# Patient Record
Sex: Male | Born: 1970 | Race: White | Hispanic: No | Marital: Married | Smoking: Never smoker
Health system: Southern US, Community
[De-identification: ages and names within clinical notes are randomized; demographics above are authoritative.]

## PROBLEM LIST (undated history)

## (undated) DIAGNOSIS — F329 Major depressive disorder, single episode, unspecified: Secondary | ICD-10-CM

## (undated) DIAGNOSIS — I10 Essential (primary) hypertension: Secondary | ICD-10-CM

## (undated) DIAGNOSIS — F32A Depression, unspecified: Secondary | ICD-10-CM

## (undated) HISTORY — DX: Major depressive disorder, single episode, unspecified: F32.9

## (undated) HISTORY — DX: Depression, unspecified: F32.A

## (undated) HISTORY — DX: Essential (primary) hypertension: I10

---

## 1998-09-14 ENCOUNTER — Emergency Department (HOSPITAL_COMMUNITY): Admission: EM | Admit: 1998-09-14 | Discharge: 1998-09-14 | Payer: Self-pay | Admitting: Emergency Medicine

## 2003-09-05 ENCOUNTER — Encounter: Payer: Self-pay | Admitting: Emergency Medicine

## 2003-09-06 ENCOUNTER — Inpatient Hospital Stay (HOSPITAL_COMMUNITY): Admission: EM | Admit: 2003-09-06 | Discharge: 2003-09-07 | Payer: Self-pay

## 2003-09-10 ENCOUNTER — Encounter: Admission: RE | Admit: 2003-09-10 | Discharge: 2003-09-10 | Payer: Self-pay | Admitting: Family Medicine

## 2007-08-27 ENCOUNTER — Emergency Department (HOSPITAL_COMMUNITY): Admission: EM | Admit: 2007-08-27 | Discharge: 2007-08-27 | Payer: Self-pay | Admitting: Emergency Medicine

## 2011-04-17 NOTE — H&P (Signed)
NAME:  Cameron Chase, Cameron Chase                           ACCOUNT NO.:  [id number]   MEDICAL RECORD NO.:  [id number]                   PATIENT TYPE:  INP   LOCATION:  3000                                 FACILITY:  [facility]   PHYSICIAN:  Cameron Chase, M.D.             DATE OF BIRTH:  [birth year]   DATE OF ADMISSION:  09/05/2003  DATE OF DISCHARGE:                                HISTORY & PHYSICAL   PRIMARY CARE PHYSICIAN:  Cameron Chase, M.D. at [facility]   REFERRING PHYSICIAN:  None.   CONSULTING PHYSICIAN:  None.   CHIEF COMPLAINT:  Worsening headache.   HISTORY OF PRESENT ILLNESS:  [name] is a 40 year old Caucasian male  who was treated for a Staph infection on his right foot approximately 2  weeks ago.  He was treated with Levaquin. This wound has subsequently  resolved.  The patient then stated this past weekend that he developed a  cold-type syndrome including rhinorrhea, minor fever and chills, decreased  appetite and sore throat.  Then, on Monday, the patient developed a headache  2 days prior to admission.  This headache worsened; and, at the time of  admission, the patient stated that this headache was severe.  He describes  this headache as a throbbing sensation throughout his entire head; and this  headache is worsened by light, by sound, and by any movement of his body. He  also has noticed increased irritability over the past couple of days.   At the time of presentation, the patient states that he has had a mild cough  and that he developed nausea and vomiting today and that last night he had a  fever to 102 degrees F. He denies diarrhea, endorses decreased appetite. He  states that he has severe pain at the base of his head, that his neck feels  stiff and that the pain in his head is greatly increased whenever he moves  his head.  The patient had no recent insect bites, no recent travel history.  Of note though the patient was recently [place] a petting  zoo on August 31, 2003  which is approximately 5 days ago.   ALLERGIES:  No known drug allergies.   MEDICATIONS:  Lexapro 20 mg daily.   PAST MEDICAL HISTORY:  1. A history of Staph infection in his right foot approximately 2 weeks ago     treated with Levaquin.  2. History of depression and anxiety.   SOCIAL HISTORY AND FAMILY HISTORY:  The patient smokes 5 cigarettes a week,  drinks 8-10 drinks a week.  Denies any drug use.  He has a brother with type  1 diabetes.  There is no coronary artery disease and no MI in the family.  He has an aunt with breast cancer.   REVIEW OF SYSTEMS:  Positive for above with positive fevers and nighttime  chills.  Positive for  nausea and vomiting.  Currently negative rhinorrhea.  No hematuria.  No blood in the stool.  Positive MSK pain, positive  arthralgias.  No changes in hearing.  Positive changes in vision, states  that his vision is blurry.   PHYSICAL EXAMINATION:  GENERAL: This is a mildly obese Caucasian male in  obvious discomfort holding his head.  CARDIOVASCULAR:  Regular rate and rhythm no murmurs, rubs, or gallops.  LUNGS:  Clear to auscultation bilaterally.  No wheezes, crackles, or rales.  ABDOMEN:  Positive bowel sounds, nontender, nondistended, soft.  HEENT/NECK: No lymphadenopathy, no exudates, no erythema.  Pupils are  PERRLA.  EXTREMITIES:  The extremities are thin, no rashes.  There is an  approximately a 4 cm healed incision on the dorsum of his right foot.  NEUROLOGIC:  Negative Kernig.  Positive Brudzinski, positive nuchal  rigidity.  Cranial nerves II-XII are intact; 2+ reflexes throughout all  extremities.   LABS:  Admission labs include CBC: White blood count 9.1, H&H 15.4 and 43.7,  platelets 294.  CSF with a white blood cell count of 340 and a second repeat  at 353.  Glucose is 69; total protein at 59; RBC at 3.  Gram stain negative  for organisms.  A CT of the head was within normal limits.   ASSESSMENT AND PLAN:   This is a 40 year old Caucasian male with a 3-day  history of worsening headache.  1. Headache.  Given his symptoms the differential diagnoses include viral     versus bacterial meningitis versus encephalitis versus acute intracranial     process including aneurysm, although the last being very unlikely     considering a normal head CT.  But it looks like the diagnosis is viral     meningitis given his increased WBCs, mild protein elevation, and no     organisms on cerebrospinal fluid; also with his symptoms such as     photophobia on physical exam.  However, given his recent history of staph     infection of his foot and his very recent antibiotic treatment the     cerebrospinal fluid may be lower yield.  It is also felt we need to rule     out bacterial meningitis.  He has no history of recent infection, bites,     or rashes for Lyme disease and other bacteria carrying viruses are lower     on the list. He has no signs or symptoms of encephalitis other than the     ______ concentration ability enterovirus such as coxsackie and _____     viruses are higher on the differential.  It is of note that the patient     stated that he had sore throat prior to onset of his neurologic symptoms.     Other viruses to consider are blood viruses but this  typically presents     more with an encephalitis picture.  Parasitic, herpes simplex viruses and     herpes viruses can also cause this too, however, he has no current     lesions.  Treatment of him will be supportive and provide hydration via     IV and give pain medicines by giving Dilaudid, PCA and Toradol p.r.n.  I     will continue his vancomycin and his Rocephin.  Also give Phenergan for     nausea.  Will repeat CBC and BMET in the a.m.  2. Pain.  Will give Toradol IV and start Dilaudid PCA.  I hesitate  to give     him too much sedation as I would like to have continued evaluation of his    neurologic status.  I will do q.4h. neuro checks.  3.  Depression and anxiety.  I will continue Lexapro.      Cameron Bombard, MD                          Cameron Chase, M.D.    SJ/MEDQ  D:  09/06/2003  T:  09/06/2003  Job:  [id number]

## 2011-04-17 NOTE — Discharge Summary (Signed)
NAME:  PACE, [name] NO.:  [id number]   MEDICAL RECORD NO.:  [id number]                   PATIENT TYPE:  INP   LOCATION:  6126                                 FACILITY:  [facility]   PHYSICIAN:  Penni Bombard, MD                    DATE OF BIRTH:  [birth year]   DATE OF ADMISSION:  09/05/2003  DATE OF DISCHARGE:  09/07/2003                                 DISCHARGE SUMMARY   PRIMARY CARE PHYSICIAN:  Dr. Merla Riches at [facility].   REFERRING PHYSICIAN:  Dr. Merla Riches.   CONSULTING PHYSICIAN:  None.   FINAL DIAGNOSES:  1. Viral meningitis.  2. History of depression and anxiety.   PRINCIPAL PROCEDURES:  1. CT of the head, within normal limits, showed no abnormalities.  2. Lumbar procedure with the following results:  WBC 340, second vial WBC     353, rbc's 3, glucose 69, total protein 59, Gram stain no organisms,     culture is pending.   ADMISSION HP:  Please see chart.   LABORATORY DATA:  CBC:  WBC 9.1, H&H 15.4 and 43.7, platelets 294.  CSF  fluid as above under procedures.   HOSPITAL COURSE:  [name] is a 40 year old Caucasian male who was  treated for a staph infection approximately two weeks ago on his foot with  Levaquin.  This infection was resolving.  He then subsequently this past  weekend developed headache, and that progressively got worse.  On Tuesday  developed emesis with an unrelenting headache.  Presented to the emergency  room where he got a head CT which was within normal limits and a lumbar  puncture which was consistent with viral meningitis.  The patient was  admitted to the teaching service and started on a Dilaudid PCA pump  secondary to the severity of his head pain.  He also was continued on the  vancomycin and Rocephin that was started in the emergency room.  This was  continued since the patient had had one day of antibiotics prior to  admission.  It was thought that there was the possibility of a  partially  treated bacterial meningitis. Given the fact that he had recently had a  staph infection on his foot and given the high incidence of MRSA in this  area, he was continued on vancomycin.  On the first day of admission the  patient had two episodes of emesis with relief of his headache after the  emesis.  His pain was relieved by the Dilaudid PCA.  On the second day of  his hospitalization the PCA was discontinued, and the patient did quite well  on p.o. Vicodin.  The patient was discharged after nearly 48 hours of  hospitalization.  However, his CSF cultures had not yet returned.  It was  thought that the patient was very stable and had improved rapidly.  The  patient was sent out on three days of Omnicef in order to cover him for a  possible partially treated bacterial meningitis until the return of his  cultures, which will be possibly 48 hours from the time of discharge.  At  the time of dictation the patient was stable, had a minor headache which was  responsive to Vicodin, and was tolerating p.o. well.    INSTRUCTIONS TO PATIENT AND FAMILY:  The patient was instructed to return to  Dr. Netta Corrigan office or to the emergency room if he had worsening  headache, fevers, nausea, or vomiting.  I had told the patient I would call  him with the results of his CSF culture when they returned in approximately  two days.                                                Penni Bombard, MD    SJ/MEDQ  D:  09/07/2003  T:  09/08/2003  Job:  [id number]   cc:   Harrel Lemon. Merla Riches, M.D.  [address]  [place]  [place] [address]  Fax: [phone]

## 2011-09-10 LAB — DIFFERENTIAL
Basophils Absolute: 0
Basophils Relative: 1
Eosinophils Absolute: 0.2
Eosinophils Relative: 3
Lymphocytes Relative: 15
Lymphs Abs: 1
Monocytes Absolute: 0.6
Monocytes Relative: 10
Neutro Abs: 4.3
Neutrophils Relative %: 70

## 2011-09-10 LAB — I-STAT 8, (EC8 V) (CONVERTED LAB)
Acid-Base Excess: 1
Bicarbonate: 26.5 — ABNORMAL HIGH
HCT: 42
Operator id: 285491
pCO2, Ven: 43.2 — ABNORMAL LOW

## 2011-09-10 LAB — URINALYSIS, ROUTINE W REFLEX MICROSCOPIC
Bilirubin Urine: NEGATIVE
Glucose, UA: NEGATIVE
Hgb urine dipstick: NEGATIVE
Ketones, ur: NEGATIVE
Nitrite: NEGATIVE
Protein, ur: NEGATIVE
Specific Gravity, Urine: 1.022
Urobilinogen, UA: 1
pH: 7

## 2011-09-10 LAB — CBC
HCT: 41.6
Hemoglobin: 14.5
MCHC: 34.9
MCV: 86.5
Platelets: 272
RBC: 4.81
RDW: 12.7
WBC: 6.2

## 2011-09-10 LAB — POCT I-STAT CREATININE: Creatinine, Ser: 1

## 2012-07-18 DIAGNOSIS — F329 Major depressive disorder, single episode, unspecified: Secondary | ICD-10-CM | POA: Insufficient documentation

## 2012-07-18 DIAGNOSIS — F32A Depression, unspecified: Secondary | ICD-10-CM | POA: Insufficient documentation

## 2012-07-18 DIAGNOSIS — I10 Essential (primary) hypertension: Secondary | ICD-10-CM | POA: Insufficient documentation

## 2012-07-29 DIAGNOSIS — N529 Male erectile dysfunction, unspecified: Secondary | ICD-10-CM | POA: Insufficient documentation

## 2012-09-24 ENCOUNTER — Ambulatory Visit: Payer: Self-pay | Admitting: Physician Assistant

## 2013-10-23 DIAGNOSIS — E669 Obesity, unspecified: Secondary | ICD-10-CM | POA: Insufficient documentation

## 2013-12-09 ENCOUNTER — Ambulatory Visit (INDEPENDENT_AMBULATORY_CARE_PROVIDER_SITE_OTHER): Payer: Commercial Managed Care - PPO | Admitting: Physician Assistant

## 2013-12-09 VITALS — BP 142/78 | HR 78 | Temp 98.6°F | Resp 18 | Ht 75.0 in | Wt 253.0 lb

## 2013-12-09 DIAGNOSIS — M79609 Pain in unspecified limb: Secondary | ICD-10-CM

## 2013-12-09 DIAGNOSIS — L600 Ingrowing nail: Secondary | ICD-10-CM

## 2013-12-09 DIAGNOSIS — M79675 Pain in left toe(s): Secondary | ICD-10-CM

## 2013-12-09 MED ORDER — DOXYCYCLINE HYCLATE 100 MG PO CAPS: 100.0000 mg | ORAL_CAPSULE | Freq: Two times a day (BID) | ORAL | Status: DC

## 2013-12-09 MED ORDER — HYDROCODONE-ACETAMINOPHEN 5-325 MG PO TABS
1.0000 | ORAL_TABLET | Freq: Four times a day (QID) | ORAL | Status: DC | PRN
Start: 2013-12-09 — End: 2018-07-07

## 2013-12-09 NOTE — Progress Notes (Signed)
   Subjective:    Patient ID: Cameron Chase, male    DOB: 1971-01-11, 43 y.o.   MRN: [id number]  HPI 43 year old male presents for evaluation of an ingrown toenail of his left great toe.  States 2 days ago he noticed that the lateral nail fold became very painful and started draining pus. Is not sure if the nail was ingrown before that, but thinks that it possibly could have been. Was not painful prior to 2 days ago.  Denies fever, chills, nausea, vomiting, or headache.  Hx of ingrown nail in the past.   Patient is otherwise doing well with no other concerns today.     Review of Systems  Constitutional: Negative for fever and chills.  Gastrointestinal: Negative for nausea and vomiting.  Skin: Positive for color change.  Neurological: Negative for headaches.       Objective:   Physical Exam  Constitutional: He is oriented to person, place, and time. He appears well-developed and well-nourished.  HENT:  Head: Normocephalic and atraumatic.  Right Ear: External ear normal.  Left Ear: External ear normal.  Mouth/Throat: Oropharynx is clear and moist. No oropharyngeal exudate.  Eyes: Conjunctivae are normal.  Neck: Normal range of motion.  Cardiovascular: Normal rate.   Pulmonary/Chest: Effort normal.  Neurological: He is alert and oriented to person, place, and time.  Skin:  Left great toe has proud flesh overgrowing lateral nail.  + pain to touch with purulent drainage from nail fold.  No surrounding cellulitis or pain with ROM of toe.   Psychiatric: He has a normal mood and affect. His behavior is normal. Judgment and thought content normal.     PROCEDURE NOTE: Verbal consent obtained.  Risks and benefits explained.  Patient made informed decision under sound mind and judgement. Sterile technique employed. Numbing: Anesthesia obtained with 1:1 ratio 2% plain lidocaine with 0.5% Marcaine, 4 cc total. Betadine prep per usual protocol.  Lateral nail lifted without difficulty and  removed in total. Proximal aspect of nail bed explored revealing no nail remnants. Ingrown tissue debrided and irrigated. No active bleeding. Xeroform dressing applied. Wound care instructions including precautions covered with patient. Handout given.       Assessment & Plan:  Ingrown toenail - Plan: doxycycline (VIBRAMYCIN) 100 MG capsule  Toe pain, left - Plan: HYDROcodone-acetaminophen (NORCO) 5-325 MG per tablet  Discussed at home after care Norco 5/325 mg to take for severe pain RTC precautions discussed Doxycycline 100 mg bid x 10 days Follow up as needed.

## 2015-10-08 ENCOUNTER — Ambulatory Visit (INDEPENDENT_AMBULATORY_CARE_PROVIDER_SITE_OTHER): Payer: Commercial Managed Care - PPO | Admitting: Family Medicine

## 2015-10-08 VITALS — BP 128/90 | HR 98 | Temp 98.5°F | Resp 18 | Ht 77.0 in | Wt 274.4 lb

## 2015-10-08 DIAGNOSIS — Z23 Encounter for immunization: Secondary | ICD-10-CM

## 2015-10-08 DIAGNOSIS — H1013 Acute atopic conjunctivitis, bilateral: Secondary | ICD-10-CM | POA: Diagnosis not present

## 2015-10-08 DIAGNOSIS — H00013 Hordeolum externum right eye, unspecified eyelid: Secondary | ICD-10-CM

## 2015-10-08 DIAGNOSIS — H01003 Unspecified blepharitis right eye, unspecified eyelid: Secondary | ICD-10-CM

## 2015-10-08 MED ORDER — OFLOXACIN 0.3 % OP SOLN: 2.0000 [drp] | Freq: Four times a day (QID) | OPHTHALMIC | Status: DC

## 2015-10-08 MED ORDER — OLOPATADINE HCL 0.1 % OP SOLN: 1.0000 [drp] | Freq: Two times a day (BID) | OPHTHALMIC | Status: DC

## 2015-10-08 MED ORDER — AMOXICILLIN-POT CLAVULANATE 875-125 MG PO TABS: 1.0000 | ORAL_TABLET | Freq: Two times a day (BID) | ORAL | Status: DC

## 2015-10-08 NOTE — Patient Instructions (Addendum)
Augmentin 875 one twice daily (amoxicillin/clavulanate)  Ofloxacin eyedrops 1 or 2 drops every 3-4 hours for the first couple of days, then 4 times daily  Return at any time if abruptly worse, or if not improving at all over the next 48 hours please return  Try Allegra for your allergy (fexofenadine)  If that does not do the job try Patanol eyedrops.     Stye A stye is a bump on your eyelid caused by a bacterial infection. A stye can form inside the eyelid (internal stye) or outside the eyelid (external stye). An internal stye may be caused by an infected oil-producing gland inside your eyelid. An external stye may be caused by an infection at the base of your eyelash (hair follicle). Styes are very common. Anyone can get them at any age. They usually occur in just one eye, but you may have more than one in either eye.  CAUSES  The infection is almost always caused by bacteria called Staphylococcus aureus. This is a common type of bacteria that lives on your skin. RISK FACTORS You may be at higher risk for a stye if you have had one before. You may also be at higher risk if you have:  Diabetes.  Long-term illness.  Long-term eye redness.  A skin condition called seborrhea.  High fat levels in your blood (lipids). SIGNS AND SYMPTOMS  Eyelid pain is the most common symptom of a stye. Internal styes are more painful than external styes. Other signs and symptoms may include:  Painful swelling of your eyelid.  A scratchy feeling in your eye.  Tearing and redness of your eye.  Pus draining from the stye. DIAGNOSIS  Your health care provider may be able to diagnose a stye just by examining your eye. The health care provider may also check to make sure:  You do not have a fever or other signs of a more serious infection.  The infection has not spread to other parts of your eye or areas around your eye. TREATMENT  Most styes will clear up in a few days without treatment. In some  cases, you may need to use antibiotic drops or ointment to prevent infection. Your health care provider may have to drain the stye surgically if your stye is:  Large.  Causing a lot of pain.  Interfering with your vision. This can be done using a thin blade or a needle.  HOME CARE INSTRUCTIONS   Take medicines only as directed by your health care provider.  Apply a clean, warm compress to your eye for 10 minutes, 4 times a day.  Do not wear contact lenses or eye makeup until your stye has healed.  Do not try to pop or drain the stye. SEEK MEDICAL CARE IF:  You have chills or a fever.  Your stye does not go away after several days.  Your stye affects your vision.  Your eyeball becomes swollen, red, or painful. MAKE SURE YOU:  Understand these instructions.  Will watch your condition.  Will get help right away if you are not doing well or get worse.   This information is not intended to replace advice given to you by your health care provider. Make sure you discuss any questions you have with your health care provider.   Document Released: 08/26/2005 Document Revised: 12/07/2014 Document Reviewed: 03/02/2014 Elsevier Interactive Patient Education [employer]2016 Elsevier Inc.

## 2015-10-08 NOTE — Progress Notes (Signed)
Patient ID: Cameron Chase, male    DOB: 1971/10/25  Age: 44 y.o. MRN: [id number]  Chief Complaint  Patient presents with  . Eye Injury     right eye, couple days  . Flu Vaccine    Subjective:   44 year old man with a couple day history of the right eye getting swollen and painful.No injury. He has not been working out and a lot of dust or exposed foreign bodies. He had a routine weekend before this well. He has been trying to use warm compresses on it. Also has used an OTC moistening propylene glycol eyedrops. He wears reading glasses. He has had styes in the past, but nothing like this.  Current allergies, medications, problem list, past/family and social histories reviewed.  Objective:  BP 128/90 mmHg  Pulse 98  Temp(Src) 98.5 F (36.9 C) (Oral)  Resp 18  Ht 6\' 5"  (1.956 m)  Wt 274 lb 6.4 oz (124.467 kg)  BMI 32.53 kg/m2  SpO2 98%  Swollen right upper eyelid. Conjunctiva looks okay except for little pus draining around to it. The site of abscessing of the R-wave is on the medial one third of the upper lid. His fundi are benign. EOMs intact.  Assessment & Plan:   Assessment: 1. Sty, right   2. Blepharitis of right eye   3. Needs flu shot   4. Allergic conjunctivitis, bilateral       Plan: Try over-the-counter Allegra. Also gave him  prescription for some allergy eyedrops. Flu shot today.  Orders Placed This Encounter  Procedures  . Flu Vaccine QUAD 36+ mos IM    Meds ordered this encounter  Medications  . amoxicillin-clavulanate (AUGMENTIN) 875-125 MG tablet    Sig: Take 1 tablet by mouth 2 (two) times daily.    Dispense:  14 tablet    Refill:  0  . ofloxacin (OCUFLOX) 0.3 % ophthalmic solution    Sig: Place 2 drops into the right eye 4 (four) times daily.    Dispense:  5 mL    Refill:  0  . olopatadine (PATANOL) 0.1 % ophthalmic solution    Sig: Place 1 drop into both eyes 2 (two) times daily.    Dispense:  5 mL    Refill:  12         Patient  Instructions  Augmentin 875 one twice daily (amoxicillin/clavulanate)  Ofloxacin eyedrops 1 or 2 drops every 3-4 hours for the first couple of days, then 4 times daily  Return at any time if abruptly worse, or if not improving at all over the next 48 hours please return  Stye A stye is a bump on your eyelid caused by a bacterial infection. A stye can form inside the eyelid (internal stye) or outside the eyelid (external stye). An internal stye may be caused by an infected oil-producing gland inside your eyelid. An external stye may be caused by an infection at the base of your eyelash (hair follicle). Styes are very common. Anyone can get them at any age. They usually occur in just one eye, but you may have more than one in either eye.  CAUSES  The infection is almost always caused by bacteria called Staphylococcus aureus. This is a common type of bacteria that lives on your skin. RISK FACTORS You may be at higher risk for a stye if you have had one before. You may also be at higher risk if you have:  Diabetes.  Long-term illness.  Long-term eye redness.  A  skin condition called seborrhea.  High fat levels in your blood (lipids). SIGNS AND SYMPTOMS  Eyelid pain is the most common symptom of a stye. Internal styes are more painful than external styes. Other signs and symptoms may include:  Painful swelling of your eyelid.  A scratchy feeling in your eye.  Tearing and redness of your eye.  Pus draining from the stye. DIAGNOSIS  Your health care provider may be able to diagnose a stye just by examining your eye. The health care provider may also check to make sure:  You do not have a fever or other signs of a more serious infection.  The infection has not spread to other parts of your eye or areas around your eye. TREATMENT  Most styes will clear up in a few days without treatment. In some cases, you may need to use antibiotic drops or ointment to prevent infection. Your health  care provider may have to drain the stye surgically if your stye is:  Large.  Causing a lot of pain.  Interfering with your vision. This can be done using a thin blade or a needle.  HOME CARE INSTRUCTIONS   Take medicines only as directed by your health care provider.  Apply a clean, warm compress to your eye for 10 minutes, 4 times a day.  Do not wear contact lenses or eye makeup until your stye has healed.  Do not try to pop or drain the stye. SEEK MEDICAL CARE IF:  You have chills or a fever.  Your stye does not go away after several days.  Your stye affects your vision.  Your eyeball becomes swollen, red, or painful. MAKE SURE YOU:  Understand these instructions.  Will watch your condition.  Will get help right away if you are not doing well or get worse.   This information is not intended to replace advice given to you by your health care provider. Make sure you discuss any questions you have with your health care provider.   Document Released: 08/26/2005 Document Revised: 12/07/2014 Document Reviewed: 03/02/2014 Elsevier Interactive Patient Education 2016 [employer].      No Follow-up on file.   Cameron Chase,[name], MD 10/08/2015

## 2016-02-07 DIAGNOSIS — E785 Hyperlipidemia, unspecified: Secondary | ICD-10-CM | POA: Insufficient documentation

## 2016-10-20 ENCOUNTER — Other Ambulatory Visit: Payer: Self-pay | Admitting: Family Medicine

## 2016-10-20 DIAGNOSIS — H1013 Acute atopic conjunctivitis, bilateral: Secondary | ICD-10-CM

## 2016-10-24 NOTE — Telephone Encounter (Signed)
Last ov 10/2015

## 2016-11-19 ENCOUNTER — Emergency Department (INDEPENDENT_AMBULATORY_CARE_PROVIDER_SITE_OTHER)
Admission: EM | Admit: 2016-11-19 | Discharge: 2016-11-19 | Disposition: A | Discharge status: Home / Self Care | Payer: Commercial Managed Care - PPO | Source: Home / Self Care | Attending: Family Medicine | Admitting: Family Medicine

## 2016-11-19 DIAGNOSIS — J069 Acute upper respiratory infection, unspecified: Secondary | ICD-10-CM

## 2016-11-19 MED ORDER — BENZONATATE 100 MG PO CAPS
100.0000 mg | ORAL_CAPSULE | Freq: Three times a day (TID) | ORAL | 0 refills | Status: DC
Start: 2016-11-19 — End: 2018-07-07

## 2016-11-19 MED ORDER — AZITHROMYCIN 250 MG PO TABS: 250.0000 mg | ORAL_TABLET | Freq: Every day | ORAL | 0 refills | Status: DC

## 2016-11-19 NOTE — ED Triage Notes (Signed)
About 10 days ago, started with sore throat, cough, congestion.  Runny nose greenish yellow mucous

## 2016-11-19 NOTE — ED Provider Notes (Signed)
CSN: [id number]     Arrival date & time 11/19/16  1845 History   First MD Initiated Contact with Patient 11/19/16 1908     Chief Complaint  Patient presents with  . Cough   (Consider location/radiation/quality/duration/timing/severity/associated sxs/prior Treatment) HPI  Cameron Chase is a 45 y.o. male presenting to UC with c/o 10 days of worsening productive cough with yellow-green sputum, associated sinus congestion, pressure and sore throat. Multiple family members have also been sick including his daughter who is in UC now to be seen for 2 weeks of similar symptoms.  He has tried OTC cough medication with minimal relief.  Denies chest pain or SOB. No hx of asthma.    Past Medical History:  Diagnosis Date  . Depression   . Hypertension    History reviewed. No pertinent surgical history. Family History  Problem Relation Age of Onset  . Hyperlipidemia Mother   . Cancer Mother   . Depression Mother   . Hyperlipidemia Father   . Hypertension Father   . Diabetes Father   . Diabetes Brother   . Stroke Maternal Grandmother   . Mental retardation Maternal Grandfather    Social History  Substance Use Topics  . Smoking status: Never Smoker  . Smokeless tobacco: Never Used  . Alcohol use Yes    Review of Systems  Constitutional: Negative for chills and fever.  HENT: Positive for congestion, postnasal drip, rhinorrhea, sinus pain, sinus pressure and sore throat. Negative for ear pain, trouble swallowing and voice change.   Respiratory: Positive for cough. Negative for shortness of breath.   Cardiovascular: Negative for chest pain and palpitations.  Gastrointestinal: Negative for abdominal pain, diarrhea, nausea and vomiting.  Musculoskeletal: Negative for arthralgias, back pain and myalgias.  Skin: Negative for rash.    Allergies  Patient has no known allergies.  Home Medications   Prior to Admission medications   Medication Sig Start Date End Date Taking? Authorizing  Provider  amoxicillin-clavulanate (AUGMENTIN) 875-125 MG tablet Take 1 tablet by mouth 2 (two) times daily. 10/08/15   Peyton Najjaravid H Hopper, MD  azithromycin (ZITHROMAX) 250 MG tablet Take 1 tablet (250 mg total) by mouth daily. Take first 2 tablets together, then 1 every day until finished. 11/19/16   Junius FinnerErin O'Malley, PA-C  benzonatate (TESSALON) 100 MG capsule Take 1-2 capsules (100-200 mg total) by mouth every 8 (eight) hours. 11/19/16   Junius FinnerErin O'Malley, PA-C  BuPROPion HCl (WELLBUTRIN PO) Take by mouth.    Historical Provider, MD  doxycycline (VIBRAMYCIN) 100 MG capsule Take 1 capsule (100 mg total) by mouth 2 (two) times daily. Patient not taking: Reported on 10/08/2015 12/09/13   Nelva NayHeather M Marte, PA-C  HYDROcodone-acetaminophen (NORCO) 5-325 MG per tablet Take 1 tablet by mouth every 6 (six) hours as needed. Patient not taking: Reported on 10/08/2015 12/09/13   Nelva NayHeather M Marte, PA-C  ofloxacin (OCUFLOX) 0.3 % ophthalmic solution Place 2 drops into the right eye 4 (four) times daily. 10/08/15   Peyton Najjaravid H Hopper, MD  olopatadine (PATANOL) 0.1 % ophthalmic solution Place 1 drop into both eyes 2 (two) times daily. 10/08/15   Peyton Najjaravid H Hopper, MD  PRESCRIPTION MEDICATION Blood pressure medicine Unknown name    Historical Provider, MD   Meds Ordered and Administered this Visit  Medications - No data to display  BP 120/83 (BP Location: Left Arm)   Pulse 99   Temp 98.2 F (36.8 C) (Oral)   Ht 6\' 3"  (1.905 m)   Wt 275 lb (124.7  kg)   SpO2 97%   BMI 34.37 kg/m  No data found.   Physical Exam  Constitutional: He appears well-developed and well-nourished. No distress.  HENT:  Head: Normocephalic and atraumatic.  Right Ear: Tympanic membrane normal.  Left Ear: Tympanic membrane normal.  Nose: Mucosal edema present.  Mouth/Throat: Uvula is midline, oropharynx is clear and moist and mucous membranes are normal.  Eyes: Conjunctivae are normal. No scleral icterus.  Neck: Normal range of motion. Neck supple.   Cardiovascular: Normal rate, regular rhythm and normal heart sounds.   Pulmonary/Chest: Effort normal and breath sounds normal. No stridor. No respiratory distress. He has no wheezes. He has no rales. He exhibits no tenderness.  Diffuse rhonchi, clears with cough. No respiratory distress.  Musculoskeletal: Normal range of motion.  Lymphadenopathy:    He has no cervical adenopathy.  Neurological: He is alert.  Skin: Skin is warm and dry. He is not diaphoretic.  Nursing note and vitals reviewed.   Urgent Care Course   Clinical Course     Procedures (including critical care time)  Labs Review Labs Reviewed - No data to display  Imaging Review No results found.   MDM   1. Upper respiratory tract infection, unspecified type    Pt c/o 10 days of worsening sinus symptoms and productive cough with green sputum.  Will cover for bacterial cause.  Rx: azithromycin and tessalon May continue mucinex.  F/u with PCP in 1 week if not improving.     Junius Finnerrin O'Malley, PA-C 11/19/16 1950

## 2017-01-12 ENCOUNTER — Other Ambulatory Visit: Payer: Self-pay | Admitting: Family Medicine

## 2017-01-12 DIAGNOSIS — H00013 Hordeolum externum right eye, unspecified eyelid: Secondary | ICD-10-CM

## 2017-01-12 DIAGNOSIS — H01003 Unspecified blepharitis right eye, unspecified eyelid: Secondary | ICD-10-CM

## 2017-01-14 NOTE — Telephone Encounter (Signed)
Contacted patient via phone and he did not answer. I left a message stating that the ofloxacn eye drops requested are meant to be used for acute eye disorders and not long term. If he is having an acute eye issue that he thinks warrants these eye drops, he needs to seek care immediately at our office or the ED for further evaluation as we cannot prescribe this medication without evaluating his eye.

## 2017-01-29 ENCOUNTER — Ambulatory Visit (HOSPITAL_COMMUNITY)
Admission: EM | Admit: 2017-01-29 | Discharge: 2017-01-29 | Disposition: A | Discharge status: Home / Self Care | Payer: Commercial Managed Care - PPO | Attending: Family Medicine | Admitting: Family Medicine

## 2017-01-29 ENCOUNTER — Encounter (HOSPITAL_COMMUNITY): Payer: Self-pay | Admitting: *Deleted

## 2017-01-29 DIAGNOSIS — H1033 Unspecified acute conjunctivitis, bilateral: Secondary | ICD-10-CM

## 2017-01-29 MED ORDER — CIPROFLOXACIN HCL 0.3 % OP SOLN: 2.0000 [drp] | OPHTHALMIC | 0 refills | Status: DC

## 2017-01-29 NOTE — ED Triage Notes (Signed)
Started  Out   As  [employer]     Irritated  r    Eye    yest      No  Known  Causative   No  Known  Injury       Today   The  Left  Eye  Is  Red  As  Well   Feels  Gritty    Matted  This  Am   When    He  Woke      Up

## 2017-01-29 NOTE — ED Provider Notes (Cosign Needed)
CSN: [id number]     Arrival date & time 01/29/17  1840 History   None    Chief Complaint  Patient presents with  . Eye Problem   (Consider location/radiation/quality/duration/timing/severity/associated sxs/prior Treatment) 46 year old male patient presents with a 24-hour history of reddened eyes. Stating initially his symptoms started yesterday in the right eye, stating he felt the sensation as if there was something in his eye. He states later in the day when he woke up his eye was matted shut. States he cleaned his eyes out and then this morning both eyes were matted shut with discharge. His and no change in vision, he has no blurring, he has no eye pain, no light sensitivity, or other complaint.   The history is provided by the patient.  Eye Problem    Past Medical History:  Diagnosis Date  . Depression   . Hypertension    History reviewed. No pertinent surgical history. Family History  Problem Relation Age of Onset  . Hyperlipidemia Mother   . Cancer Mother   . Depression Mother   . Hyperlipidemia Father   . Hypertension Father   . Diabetes Father   . Diabetes Brother   . Stroke Maternal Grandmother   . Mental retardation Maternal Grandfather    Social History  Substance Use Topics  . Smoking status: Never Smoker  . Smokeless tobacco: Never Used  . Alcohol use Yes    Review of Systems  Reason unable to perform ROS: As covered in history of present illness.  All other systems reviewed and are negative.   Allergies  Patient has no known allergies.  Home Medications   Prior to Admission medications   Medication Sig Start Date End Date Taking? Authorizing Provider  amoxicillin-clavulanate (AUGMENTIN) 875-125 MG tablet Take 1 tablet by mouth 2 (two) times daily. 10/08/15   Peyton Najjaravid H Hopper, MD  azithromycin (ZITHROMAX) 250 MG tablet Take 1 tablet (250 mg total) by mouth daily. Take first 2 tablets together, then 1 every day until finished. 11/19/16   Junius FinnerErin O'Malley,  PA-C  benzonatate (TESSALON) 100 MG capsule Take 1-2 capsules (100-200 mg total) by mouth every 8 (eight) hours. 11/19/16   Junius FinnerErin O'Malley, PA-C  BuPROPion HCl (WELLBUTRIN PO) Take by mouth.    Historical Provider, MD  ciprofloxacin (CILOXAN) 0.3 % ophthalmic solution Place 2 drops into both eyes every 2 (two) hours. Administer 1 drop, every 2 hours, while awake, for 2 days. Then 1 drop, every 4 hours, while awake, for the next 5 days. 01/29/17   Dorena BodoLawrence Jamarl Pew, NP  doxycycline (VIBRAMYCIN) 100 MG capsule Take 1 capsule (100 mg total) by mouth 2 (two) times daily. Patient not taking: Reported on 10/08/2015 12/09/13   Nelva NayHeather M Marte, PA-C  HYDROcodone-acetaminophen (NORCO) 5-325 MG per tablet Take 1 tablet by mouth every 6 (six) hours as needed. Patient not taking: Reported on 10/08/2015 12/09/13   Nelva NayHeather M Marte, PA-C  ofloxacin (OCUFLOX) 0.3 % ophthalmic solution Place 2 drops into the right eye 4 (four) times daily. 10/08/15   Peyton Najjaravid H Hopper, MD  olopatadine (PATANOL) 0.1 % ophthalmic solution Place 1 drop into both eyes 2 (two) times daily. 10/08/15   Peyton Najjaravid H Hopper, MD  PRESCRIPTION MEDICATION Blood pressure medicine Unknown name    Historical Provider, MD   Meds Ordered and Administered this Visit  Medications - No data to display  BP 158/99 (BP Location: Right Arm)   Pulse 80   Temp 98.6 F (37 C) (Oral)   Resp 18  No data found.   Physical Exam  Constitutional: He is oriented to person, place, and time. He appears well-developed and well-nourished. No distress.  HENT:  Head: Normocephalic and atraumatic.  Right Ear: External ear normal.  Left Ear: External ear normal.  Eyes: EOM and lids are normal. Pupils are equal, round, and reactive to light. Lids are everted and swept, no foreign bodies found. Right eye exhibits discharge and exudate. Right eye exhibits no hordeolum. No foreign body present in the right eye. Left eye exhibits discharge and exudate. Left eye exhibits no hordeolum.  No foreign body present in the left eye. Right conjunctiva is injected. Right conjunctiva has no hemorrhage. Left conjunctiva is injected. Left conjunctiva has no hemorrhage.  Neck: Normal range of motion. Neck supple. No JVD present.  Lymphadenopathy:    He has no cervical adenopathy.  Neurological: He is alert and oriented to person, place, and time.  Skin: Skin is warm and dry. Capillary refill takes less than 2 seconds. He is not diaphoretic.  Psychiatric: He has a normal mood and affect.  Nursing note and vitals reviewed.   Urgent Care Course     Procedures (including critical care time)  Labs Review Labs Reviewed - No data to display  Imaging Review No results found.   Visual Acuity Review  Right Eye Distance:  20/25 Left Eye Distance:  20/15 Bilateral Distance:  2015     MDM   1. Acute bacterial conjunctivitis of both eyes    You have bacterial conjunctivitis. I prescribed Cipro eyedrops. Place 2 drops in each eye every 2 hours while awake for the next 2 days, for the following 5 days, place one to 2 drops in each eye every 4-8 hours while awake. I also recommended taking an over-the-counter as antihistamine such as Claritin, Allegra, Zyrtec, or Xyzal. Should your symptoms persist, follow up with her primary care provider, an ophthalmologist, or return to clinic.     Dorena Bodo, NP 01/29/17 2041

## 2017-01-29 NOTE — ED Notes (Signed)
Visual  Acuity   20/  15  Left     20/25    Right

## 2017-01-29 NOTE — Discharge Instructions (Addendum)
You have bacterial conjunctivitis. I prescribed Cipro eyedrops. Place 2 drops in each eye every 2 hours while awake for the next 2 days, for the following 5 days, place one to 2 drops in each eye every 4-8 hours while awake. I also recommended taking an over-the-counter as antihistamine such as Claritin, Allegra, Zyrtec, or Xyzal. Should your symptoms persist, follow up with her primary care provider, an ophthalmologist, or return to clinic.

## 2018-06-24 ENCOUNTER — Encounter: Payer: Commercial Managed Care - PPO | Admitting: Family Medicine

## 2018-07-07 ENCOUNTER — Other Ambulatory Visit: Payer: Self-pay

## 2018-07-07 ENCOUNTER — Encounter: Payer: Self-pay | Admitting: Family Medicine

## 2018-07-07 ENCOUNTER — Ambulatory Visit (INDEPENDENT_AMBULATORY_CARE_PROVIDER_SITE_OTHER): Payer: Commercial Managed Care - PPO | Admitting: Family Medicine

## 2018-07-07 VITALS — BP 130/83 | HR 86 | Temp 97.7°F | Ht 75.0 in | Wt 276.4 lb

## 2018-07-07 DIAGNOSIS — Z114 Encounter for screening for human immunodeficiency virus [HIV]: Secondary | ICD-10-CM | POA: Diagnosis not present

## 2018-07-07 DIAGNOSIS — Z0000 Encounter for general adult medical examination without abnormal findings: Secondary | ICD-10-CM

## 2018-07-07 DIAGNOSIS — Z13220 Encounter for screening for lipoid disorders: Secondary | ICD-10-CM

## 2018-07-07 DIAGNOSIS — Z6834 Body mass index (BMI) 34.0-34.9, adult: Secondary | ICD-10-CM | POA: Diagnosis not present

## 2018-07-07 DIAGNOSIS — I10 Essential (primary) hypertension: Secondary | ICD-10-CM

## 2018-07-07 DIAGNOSIS — F329 Major depressive disorder, single episode, unspecified: Secondary | ICD-10-CM

## 2018-07-07 DIAGNOSIS — Z131 Encounter for screening for diabetes mellitus: Secondary | ICD-10-CM

## 2018-07-07 DIAGNOSIS — F32A Depression, unspecified: Secondary | ICD-10-CM

## 2018-07-07 MED ORDER — BUPROPION HCL 100 MG PO TABS: 100.0000 mg | ORAL_TABLET | Freq: Every day | ORAL | 1 refills | Status: DC

## 2018-07-07 MED ORDER — HYDROCHLOROTHIAZIDE 25 MG PO TABS: ORAL_TABLET | ORAL | 1 refills | Status: DC

## 2018-07-07 MED ORDER — AMLODIPINE BESYLATE 5 MG PO TABS: 5.0000 mg | ORAL_TABLET | Freq: Every day | ORAL | 1 refills | Status: DC

## 2018-07-07 MED ORDER — LOSARTAN POTASSIUM 50 MG PO TABS: ORAL_TABLET | ORAL | 1 refills | Status: DC

## 2018-07-07 NOTE — Patient Instructions (Addendum)
Cut back on fast food and prepare your own food as much as possible. continue exercise, but if you need further assistance with weight loss: Cameron Quincearen Beasley, MD Medical Weight Loss Management  . [phone] .  Keeping you healthy  Get these tests  Blood pressure- Have your blood pressure checked once a year by your healthcare provider.  Normal blood pressure is 120/80.  Weight- Have your body mass index (BMI) calculated to screen for obesity.  BMI is a measure of body fat based on height and weight. You can also calculate your own BMI at [email or web address]://www.west-esparza.com/www.[email or web address]/bmi/.  Cholesterol- Have your cholesterol checked regularly starting at age 47, sooner may be necessary if you have diabetes, high blood pressure, if a family member developed heart diseases at an early age or if you smoke.   Chlamydia, HIV, and other sexual transmitted disease- Get screened each year until the age of 47 then within three months of each new sexual partner.  Diabetes- Have your blood sugar checked regularly if you have high blood pressure, high cholesterol, a family history of diabetes or if you are overweight.  Get these vaccines  Flu shot- Every fall.  Tetanus shot- Every 10 years.  Menactra- Single dose; prevents meningitis.  Take these steps  Don't smoke- If you do smoke, ask your healthcare provider about quitting. For tips on how to quit, go to www.smokefree.gov or call 1-800-QUIT-NOW.  Be physically active- Exercise 5 days a week for at least 30 minutes.  If you are not already physically active start slow and gradually work up to 30 minutes of moderate physical activity.  Examples of moderate activity include walking briskly, mowing the yard, dancing, swimming bicycling, etc.  Eat a healthy diet- Eat a variety of healthy foods such as fruits, vegetables, low fat milk, low fat cheese, yogurt, lean meats, poultry, fish, beans, tofu, etc.  For more information on healthy eating, go to  www.thenutritionsource.org  Drink alcohol in moderation- Limit alcohol intake two drinks or less a day.  Never drink and drive.  Dentist- Brush and floss teeth twice daily; visit your dentis twice a year.  Depression-Your emotional health is as important as your physical health.  If you're feeling down, losing interest in things you normally enjoy please talk with your healthcare provider.  Gun Safety- If you keep a gun in your home, keep it unloaded and with the safety lock on.  Bullets should be stored separately.  Helmet use- Always wear a helmet when riding a motorcycle, bicycle, rollerblading or skateboarding.  Safe sex- If you may be exposed to a sexually transmitted infection, use a condom  Seat belts- Seat bels can save your life; always wear one.  Smoke/Carbon Monoxide detectors- These detectors need to be installed on the appropriate level of your home.  Replace batteries at least once a year.  Skin Cancer- When out in the sun, cover up and use sunscreen SPF 15 or higher.  Violence- If anyone is threatening or hurting you, please tell your healthcare provider.   IF you received an x-ray today, you will receive an invoice from [facility]. Please contact [facility] at [phone] with questions or concerns regarding your invoice.   IF you received labwork today, you will receive an invoice from [facility]. Please contact [facility] at [phone] with questions or concerns regarding your invoice.   Our billing staff will not be able to assist you with questions regarding bills from these companies.  You will be contacted with the lab results  as soon as they are available. The fastest way to get your results is to activate your My Chart account. Instructions are located on the last page of this paperwork. If you have not heard from [place] regarding the results in 2 weeks, please contact this office.

## 2018-07-07 NOTE — Progress Notes (Signed)
Subjective:    Patient ID: Cameron Chase, male    DOB: 05-Jun-1971, 47 y.o.   MRN: [id number]  HPI Cameron Chase is a 47 y.o. male Presents today for: Chief Complaint  Patient presents with  . Annual Exam    CPE   New patient to me, here for physical and to establish care. Prior pt of [facility], but lives closer to here and family also patients here. Divorced, has 4 kids. 5612,15 (boy and girl at [employer]) 6717 (son at [employer]), 4120 (dtr at [employer]). Divorced 9 years.  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   History of hypertension, hyperlipidemia, depression, erectile dysfunction.  Hypertension: BP Readings from Last 3 Encounters:  07/07/18 130/83  01/29/17 158/99  11/19/16 120/83   Lab Results  Component Value Date   CREATININE 1.0 08/27/2007  creat normal in 2015.  Currently on HCTZ 25 mg daily, amlodipine 5 mg daily, losartan 50 mg daily. No new side effects.   Obesity: Peak of 307lbs 4 years ago, then down to 250lbs with biweekly weigh ins and dash diet. Wellbutrin helped as well.  Body mass index is 34.55 kg/m. Wt Readings from Last 3 Encounters:  07/07/18 276 lb 6.4 oz (125.4 kg)  11/19/16 275 lb (124.7 kg)  10/08/15 274 lb 6.4 oz (124.5 kg)  exercise 2-3 times per week at [employer]. Elliptical and walking.   No sugar containing beverages. Fast food 3x/week. Breakfast most days. 4-5 alcoholic drinks per week.   Dad with DM2, brother with Dm1.   Depression: Takes Wellbutrin 100 mg daily (rx for 2/day, but doing well at once per day). Feels stress has been more manageable recently.   Hyperlipidemia: Listed on problem list - no meds.  No results found for: CHOL, HDL, LDLCALC, LDLDIRECT, TRIG, CHOLHDL No results found for: ALT, AST, GGT, ALKPHOS, BILITOT  Erectile dysfunction: Has used Cialis in past. No new side effects. Has some at home. Not recently sexually active. Declines STI testing.   Cancer screening: No early  cancers in family.   Immunizations: Immunization History  Administered Date(s) Administered  . Influenza,inj,Quad PF,6+ Mos 10/08/2015  tetanus within 5 years- he will check on date.    Depression screen: [facility] 07/07/2018 07/07/2018 10/08/2015  Decreased Interest 0 0 0  Down, Depressed, Hopeless 0 0 0  PHQ - 2 Score 0 0 0    Vision screen:  Visual Acuity Screening   Right eye Left eye Both eyes  Without correction: 20/20 20/20 20/15   With correction:     eye itching/redness - past few years, followed by optho. Has drops if needed. Dr. Harriette BouillonMcFarland, 9 months ago.   Dental: Seen every year.   Exercise: As above.   Patient Active Problem List   Diagnosis Date Noted  . Hyperlipidemia 02/07/2016  . Erectile dysfunction 07/29/2012  . Benign hypertension 07/18/2012  . Depression 07/18/2012   Past Medical History:  Diagnosis Date  . Depression   . Hypertension    No past surgical history on file. No Known Allergies Prior to Admission medications   Medication Sig Start Date End Date Taking? Authorizing Provider  amLODipine (NORVASC) 5 MG tablet TAKE 1 TABLET BY MOUTH DAILY 06/13/18  Yes [provider]  BuPROPion HCl (WELLBUTRIN PO) Take 100 mg by mouth.    Yes [provider]  hydrochlorothiazide (HYDRODIURIL) 25 MG tablet TK 1 T PO  D 06/13/18  Yes [provider]  losartan (COZAAR) 50  MG tablet TK 1 T PO QD 04/18/18  Yes [provider]  olopatadine (PATANOL) 0.1 % ophthalmic solution Place 1 drop into both eyes 2 (two) times daily. 10/08/15  Yes Peyton Najjar, MD  tadalafil (ADCIRCA/CIALIS) 20 MG tablet TK 1 T PO D PRN FOR ERECTILE DYSFUNCTION 04/21/18  Yes [provider]   Social History   Socioeconomic History  . Marital status: Divorced    Spouse name: Not on file  . Number of children: Not on file  . Years of education: Not on file  . Highest education level: Not on file  Occupational History  . Not on  file  Social Needs  . Financial resource strain: Not on file  . Food insecurity:    Worry: Not on file    Inability: Not on file  . Transportation needs:    Medical: Not on file    Non-medical: Not on file  Tobacco Use  . Smoking status: Never Smoker  . Smokeless tobacco: Never Used  Substance and Sexual Activity  . Alcohol use: Yes  . Drug use: No  . Sexual activity: Not on file  Lifestyle  . Physical activity:    Days per week: Not on file    Minutes per session: Not on file  . Stress: Not on file  Relationships  . Social connections:    Talks on phone: Not on file    Gets together: Not on file    Attends religious service: Not on file    Active member of club or organization: Not on file    Attends meetings of clubs or organizations: Not on file    Relationship status: Not on file  . Intimate partner violence:    Fear of current or ex partner: Not on file    Emotionally abused: Not on file    Physically abused: Not on file    Forced sexual activity: Not on file  Other Topics Concern  . Not on file  Social History Narrative  . Not on file   Review of Systems 13 point review of systems per patient health survey noted.  Negative other than as indicated above or in HPI.  Occasional back pain - no new symptoms.     Objective:   Physical Exam  Constitutional: He is oriented to person, place, and time. He appears well-developed and well-nourished.  HENT:  Head: Normocephalic and atraumatic.  Right Ear: External ear normal.  Left Ear: External ear normal.  Mouth/Throat: Oropharynx is clear and moist.  Eyes: Pupils are equal, round, and reactive to light. Conjunctivae and EOM are normal.  Neck: Normal range of motion. Neck supple. No thyromegaly present.  Cardiovascular: Normal rate, regular rhythm, normal heart sounds and intact distal pulses.  Pulmonary/Chest: Effort normal and breath sounds normal. No respiratory distress. He has no wheezes.  Abdominal: Soft. He  exhibits no distension. There is no tenderness.  Musculoskeletal: Normal range of motion. He exhibits no edema or tenderness.  Lymphadenopathy:    He has no cervical adenopathy.  Neurological: He is alert and oriented to person, place, and time. He has normal reflexes.  Skin: Skin is warm and dry.  Psychiatric: He has a normal mood and affect. His behavior is normal.  Vitals reviewed.    Vitals:   07/07/18 1400  BP: 130/83  Pulse: 86  Temp: 97.7 F (36.5 C)  TempSrc: Oral  SpO2: 96%  Weight: 276 lb 6.4 oz (125.4 kg)  Height: 6\' 3"  (1.905 m)  Assessment & Plan:   Cameron Chase is a 47 y.o. male Annual physical exam  --anticipatory guidance as below in AVS, screening labs above. Health maintenance items as above in HPI discussed/recommended as applicable.   Essential hypertension - Plan: Comprehensive metabolic panel, hydrochlorothiazide (HYDRODIURIL) 25 MG tablet, amLODipine (NORVASC) 5 MG tablet, losartan (COZAAR) 50 MG tablet  - Stable, tolerating current regimen. Medications refilled. Labs pending as above.   Screening for hyperlipidemia - Plan: Lipid panel  Screening for diabetes mellitus - Plan: Comprehensive metabolic panel, Hemoglobin A1c  Depression, unspecified depression type - Plan: buPROPion (WELLBUTRIN) 100 MG tablet   Stable, tolerating current regimen. Medications refilled same dose.   BMI 34.0-34.9,adult - Plan: Lipid panel, Hemoglobin A1c  -Continue diet, exercise for weight loss.  Screen for diabetes/prediabetes with A1c.  Phone number also provided for medical bariatrics if he would like more intensive treatment for weight loss.  Screening for HIV without presence of risk factors - Plan: HIV antibody   Meds ordered this encounter  Medications  . hydrochlorothiazide (HYDRODIURIL) 25 MG tablet    Sig: 1 po qd.    Dispense:  90 tablet    Refill:  1  . buPROPion (WELLBUTRIN) 100 MG tablet    Sig: Take 1 tablet (100 mg total) by mouth daily.     Dispense:  90 tablet    Refill:  1  . amLODipine (NORVASC) 5 MG tablet    Sig: Take 1 tablet (5 mg total) by mouth daily.    Dispense:  90 tablet    Refill:  1  . losartan (COZAAR) 50 MG tablet    Sig: 1 po qd    Dispense:  90 tablet    Refill:  1   Patient Instructions    Cut back on fast food and prepare your own food as much as possible. continue exercise, but if you need further assistance with weight loss: Quillian Quince, MD Medical Weight Loss Management  . [phone] .  Keeping you healthy  Get these tests  Blood pressure- Have your blood pressure checked once a year by your healthcare provider.  Normal blood pressure is 120/80.  Weight- Have your body mass index (BMI) calculated to screen for obesity.  BMI is a measure of body fat based on height and weight. You can also calculate your own BMI at https://www.west-esparza.com/.  Cholesterol- Have your cholesterol checked regularly starting at age 60, sooner may be necessary if you have diabetes, high blood pressure, if a family member developed heart diseases at an early age or if you smoke.   Chlamydia, HIV, and other sexual transmitted disease- Get screened each year until the age of 40 then within three months of each new sexual partner.  Diabetes- Have your blood sugar checked regularly if you have high blood pressure, high cholesterol, a family history of diabetes or if you are overweight.  Get these vaccines  Flu shot- Every fall.  Tetanus shot- Every 10 years.  Menactra- Single dose; prevents meningitis.  Take these steps  Don't smoke- If you do smoke, ask your healthcare provider about quitting. For tips on how to quit, go to www.smokefree.gov or call 1-800-QUIT-NOW.  Be physically active- Exercise 5 days a week for at least 30 minutes.  If you are not already physically active start slow and gradually work up to 30 minutes of moderate physical activity.  Examples of moderate activity include walking briskly,  mowing the yard, dancing, swimming bicycling, etc.  Eat a healthy  diet- Eat a variety of healthy foods such as fruits, vegetables, low fat milk, low fat cheese, yogurt, lean meats, poultry, fish, beans, tofu, etc.  For more information on healthy eating, go to www.thenutritionsource.org  Drink alcohol in moderation- Limit alcohol intake two drinks or less a day.  Never drink and drive.  Dentist- Brush and floss teeth twice daily; visit your dentis twice a year.  Depression-Your emotional health is as important as your physical health.  If you're feeling down, losing interest in things you normally enjoy please talk with your healthcare provider.  Gun Safety- If you keep a gun in your home, keep it unloaded and with the safety lock on.  Bullets should be stored separately.  Helmet use- Always wear a helmet when riding a motorcycle, bicycle, rollerblading or skateboarding.  Safe sex- If you may be exposed to a sexually transmitted infection, use a condom  Seat belts- Seat bels can save your life; always wear one.  Smoke/Carbon Monoxide detectors- These detectors need to be installed on the appropriate level of your home.  Replace batteries at least once a year.  Skin Cancer- When out in the sun, cover up and use sunscreen SPF 15 or higher.  Violence- If anyone is threatening or hurting you, please tell your healthcare provider.   IF you received an x-ray today, you will receive an invoice from [facility]. Please contact [facility] at [phone] with questions or concerns regarding your invoice.   IF you received labwork today, you will receive an invoice from [facility]. Please contact [facility] at [phone] with questions or concerns regarding your invoice.   Our billing staff will not be able to assist you with questions regarding bills from these companies.  You will be contacted with the lab results as soon as they are available. The fastest way to get your  results is to activate your My Chart account. Instructions are located on the last page of this paperwork. If you have not heard from [place] regarding the results in 2 weeks, please contact this office.       Signed,   Meredith Staggers, MD Primary Care at [facility].  07/07/18 2:58 PM

## 2018-07-08 LAB — COMPREHENSIVE METABOLIC PANEL
ALT: 17 IU/L (ref 0–44)
AST: 23 IU/L (ref 0–40)
Albumin/Globulin Ratio: 2.2 (ref 1.2–2.2)
Albumin: 5 g/dL (ref 3.5–5.5)
Alkaline Phosphatase: 68 IU/L (ref 39–117)
BUN/Creatinine Ratio: 12 (ref 9–20)
BUN: 14 mg/dL (ref 6–24)
Bilirubin Total: 1 mg/dL (ref 0.0–1.2)
CO2: 21 mmol/L (ref 20–29)
Calcium: 10 mg/dL (ref 8.7–10.2)
Chloride: 98 mmol/L (ref 96–106)
Creatinine, Ser: 1.13 mg/dL (ref 0.76–1.27)
GFR calc Af Amer: 89 mL/min/{1.73_m2} (ref >59)
GFR calc non Af Amer: 77 mL/min/{1.73_m2} (ref >59)
Globulin, Total: 2.3 g/dL (ref 1.5–4.5)
Glucose: 82 mg/dL (ref 65–99)
Potassium: 4.1 mmol/L (ref 3.5–5.2)
Sodium: 139 mmol/L (ref 134–144)
Total Protein: 7.3 g/dL (ref 6.0–8.5)

## 2018-07-08 LAB — LIPID PANEL
Chol/HDL Ratio: 3.9 ratio (ref 0.0–5.0)
Cholesterol, Total: 205 mg/dL — ABNORMAL HIGH (ref 100–199)
HDL: 53 mg/dL (ref >39)
LDL Calculated: 120 mg/dL — ABNORMAL HIGH (ref 0–99)
Triglycerides: 162 mg/dL — ABNORMAL HIGH (ref 0–149)
VLDL Cholesterol Cal: 32 mg/dL (ref 5–40)

## 2018-07-08 LAB — HIV ANTIBODY (ROUTINE TESTING W REFLEX): HIV Screen 4th Generation wRfx: NONREACTIVE

## 2018-07-08 LAB — HEMOGLOBIN A1C
Est. average glucose Bld gHb Est-mCnc: 97 mg/dL
Hgb A1c MFr Bld: 5 % (ref 4.8–5.6)

## 2018-08-10 ENCOUNTER — Encounter: Payer: Self-pay | Admitting: Family Medicine

## 2018-08-11 MED ORDER — TADALAFIL 20 MG PO TABS: ORAL_TABLET | ORAL | 5 refills | Status: DC

## 2018-08-11 NOTE — Telephone Encounter (Signed)
Discussed at 07/07/18 appt. Refilled.

## 2018-08-15 ENCOUNTER — Encounter: Payer: Self-pay | Admitting: Family Medicine

## 2018-08-20 MED ORDER — TADALAFIL 20 MG PO TABS: ORAL_TABLET | ORAL | 5 refills | Status: DC

## 2018-11-14 ENCOUNTER — Ambulatory Visit (INDEPENDENT_AMBULATORY_CARE_PROVIDER_SITE_OTHER): Payer: Commercial Managed Care - PPO | Admitting: Family Medicine

## 2018-11-14 ENCOUNTER — Encounter: Payer: Self-pay | Admitting: Family Medicine

## 2018-11-14 ENCOUNTER — Other Ambulatory Visit: Payer: Self-pay

## 2018-11-14 VITALS — BP 126/77 | HR 83 | Temp 98.7°F | Resp 18 | Ht 75.0 in | Wt 271.6 lb

## 2018-11-14 DIAGNOSIS — Z23 Encounter for immunization: Secondary | ICD-10-CM | POA: Diagnosis not present

## 2018-11-14 DIAGNOSIS — I10 Essential (primary) hypertension: Secondary | ICD-10-CM

## 2018-11-14 DIAGNOSIS — F329 Major depressive disorder, single episode, unspecified: Secondary | ICD-10-CM

## 2018-11-14 DIAGNOSIS — F32A Depression, unspecified: Secondary | ICD-10-CM

## 2018-11-14 MED ORDER — BUPROPION HCL 100 MG PO TABS: 100.0000 mg | ORAL_TABLET | Freq: Every day | ORAL | 1 refills | Status: DC

## 2018-11-14 MED ORDER — LOSARTAN POTASSIUM 50 MG PO TABS: ORAL_TABLET | ORAL | 1 refills | Status: DC

## 2018-11-14 MED ORDER — AMLODIPINE BESYLATE 5 MG PO TABS: 5.0000 mg | ORAL_TABLET | Freq: Every day | ORAL | 1 refills | Status: DC

## 2018-11-14 MED ORDER — HYDROCHLOROTHIAZIDE 25 MG PO TABS: ORAL_TABLET | ORAL | 1 refills | Status: DC

## 2018-11-14 MED ORDER — TADALAFIL 20 MG PO TABS: ORAL_TABLET | ORAL | 5 refills | Status: DC

## 2018-11-14 NOTE — Progress Notes (Signed)
Subjective:    Patient ID: Cameron ReekDavid W Gaspari, male    DOB: 1971-06-21, 47 y.o.   MRN: [id number]  HPI Cameron Chase is a 47 y.o. male Presents today for: Chief Complaint  Patient presents with  . Medication Refill    BP meds follow up on meds    Hypertension: BP Readings from Last 3 Encounters:  11/14/18 126/77  07/07/18 130/83  01/29/17 158/99   Lab Results  Component Value Date   CREATININE 1.13 07/07/2018  Takes norvasc 5mg  QD. Rare ankle swelling, but not daily. Losartan 50mg , and hctz 25mg  qd. No missed doses. No new side effects  Erectile dysfunction: Has taken Cialis. Tolerating ok - works well at 10mg .  Has been working well, (taking 10mg  every other day to 3 days).  Cost has been ok. No change in meds at this point desired.   Depression: Takes Wellbutrin 100mg . No new side effects.  Exercise also helps with depression.  Dep[facility] screen [facility] 11/14/2018 07/07/2018 07/07/2018 10/08/2015  Decreased Interest 0 0 0 0  Down, Depressed, Hopeless 0 0 0 0  PHQ - 2 Score 0 0 0 0   Hyperlipidemia:  Lab Results  Component Value Date   CHOL 205 (H) 07/07/2018   HDL 53 07/07/2018   LDLCALC 120 (H) 07/07/2018   TRIG 162 (H) 07/07/2018   CHOLHDL 3.9 07/07/2018   Lab Results  Component Value Date   ALT 17 07/07/2018   AST 23 07/07/2018   ALKPHOS 68 07/07/2018   BILITOT 1.0 07/07/2018   The 10-year ASCVD risk score Denman George(Goff DC Jr., et al., 2013) is: 2.8%   Values used to calculate the score:     Age: [age] years     Sex: Male     Is Non-Hispanic African American: No     Diabetic: No     Tobacco smoker: No     Systolic Blood Pressure: 126 mmHg     Is BP treated: Yes     HDL Cholesterol: 53 mg/dL     Total Cholesterol: 205 mg/dL Plans on diet and exercise - exercise is good, diet is a work in progress.    Patient Active Problem List   Diagnosis Date Noted  . Hyperlipidemia 02/07/2016  . Erectile dysfunction 07/29/2012  . Benign hypertension 07/18/2012  . Depression  07/18/2012   Past Medical History:  Diagnosis Date  . Depression   . Hypertension    No past surgical history on file. No Known Allergies Prior to Admission medications   Medication Sig Start Date End Date Taking? Authorizing Provider  amLODipine (NORVASC) 5 MG tablet Take 1 tablet (5 mg total) by mouth daily. 07/07/18  Yes Shade FloodGreene, Mouhamad Teed R, MD  buPROPion (WELLBUTRIN) 100 MG tablet Take 1 tablet (100 mg total) by mouth daily. 07/07/18  Yes Shade FloodGreene, [place] R, MD  hydrochlorothiazide (HYDRODIURIL) 25 MG tablet 1 po qd. 07/07/18  Yes Shade FloodGreene, Jady Braggs R, MD  losartan (COZAAR) 50 MG tablet 1 po qd 07/07/18  Yes Shade FloodGreene, Albina Gosney R, MD  olopatadine (PATANOL) 0.1 % ophthalmic solution Place 1 drop into both eyes 2 (two) times daily. 10/08/15  Yes Peyton NajjarHopper, Tarik H, MD  tadalafil (ADCIRCA/CIALIS) 20 MG tablet TK 1 T PO D PRN FOR ERECTILE DYSFUNCTION 08/20/18  Yes Shade FloodGreene, Natausha Jungwirth R, MD   Social History   Socioeconomic History  . Marital status: Divorced    Spouse name: Not on file  . Number of children: Not on file  . Years of education: Not  on file  . Highest education level: Not on file  Occupational History  . Not on file  Social Needs  . Financial resource strain: Not on file  . Food insecurity:    Worry: Not on file    Inability: Not on file  . Transportation needs:    Medical: Not on file    Non-medical: Not on file  Tobacco Use  . Smoking status: Never Smoker  . Smokeless tobacco: Never Used  Substance and Sexual Activity  . Alcohol use: Yes  . Drug use: No  . Sexual activity: Not on file  Lifestyle  . Physical activity:    Days per week: Not on file    Minutes per session: Not on file  . Stress: Not on file  Relationships  . Social connections:    Talks on phone: Not on file    Gets together: Not on file    Attends religious service: Not on file    Active member of club or organization: Not on file    Attends meetings of clubs or organizations: Not on file    Relationship  status: Not on file  . Intimate partner violence:    Fear of current or ex partner: Not on file    Emotionally abused: Not on file    Physically abused: Not on file    Forced sexual activity: Not on file  Other Topics Concern  . Not on file  Social History Narrative  . Not on file   Review of Systems Per HPI.     Objective:   Physical Exam Vitals signs reviewed.  Constitutional:      Appearance: He is well-developed.  HENT:     Head: Normocephalic and atraumatic.  Eyes:     Pupils: Pupils are equal, round, and reactive to light.  Neck:     Vascular: No carotid bruit or JVD.  Cardiovascular:     Rate and Rhythm: Normal rate and regular rhythm.     Heart sounds: Normal heart sounds. No murmur.  Pulmonary:     Effort: Pulmonary effort is normal.     Breath sounds: Normal breath sounds. No rales.  Skin:    General: Skin is warm and dry.  Neurological:     Mental Status: He is alert and oriented to person, place, and time.    Vitals:   11/14/18 1458  BP: 126/77  Pulse: 83  Resp: 18  Temp: 98.7 F (37.1 C)  SpO2: 98%      Assessment & Plan:   Cameron Chase is a 47 y.o. male Essential hypertension - Plan: amLODipine (NORVASC) 5 MG tablet, hydrochlorothiazide (HYDRODIURIL) 25 MG tablet, losartan (COZAAR) 50 MG tablet  -  Stable, tolerating current regimen. Medications refilled.   Need for influenza vaccination - Plan: Flu Vaccine QUAD 36+ mos IM  Need for Tdap vaccination - Plan: Tdap vaccine greater than or equal to 7yo IM  Depression, unspecified depression type - Plan: buPROPion (WELLBUTRIN) 100 MG tablet  - stable. continue same Wellbutrin dose.   Meds ordered this encounter  Medications  . amLODipine (NORVASC) 5 MG tablet    Sig: Take 1 tablet (5 mg total) by mouth daily.    Dispense:  90 tablet    Refill:  1  . buPROPion (WELLBUTRIN) 100 MG tablet    Sig: Take 1 tablet (100 mg total) by mouth daily.    Dispense:  90 tablet    Refill:  1  .  hydrochlorothiazide (HYDRODIURIL)  25 MG tablet    Sig: 1 po qd.    Dispense:  90 tablet    Refill:  1  . losartan (COZAAR) 50 MG tablet    Sig: 1 po qd    Dispense:  90 tablet    Refill:  1  . tadalafil (ADCIRCA/CIALIS) 20 MG tablet    Sig: TK 1 T PO D PRN FOR ERECTILE DYSFUNCTION    Dispense:  10 tablet    Refill:  5   Patient Instructions       If you have lab work done today you will be contacted with your lab results within the next 2 weeks.  If you have not heard from [place] then please contact us. The fastest way to get your results is to register for My Chart.   IF you received an x-ray today, you will receive an invoice from [facility]. Please contact [facility] at [phone] with questions or concerns regarding your invoice.   IF you received labwork today, you will receive an invoice from [facility]. Please contact [facility] at [phone] with questions or concerns regarding your invoice.   Our billing staff will not be able to assist you with questions regarding bills from these companies.  You will be contacted with the lab results as soon as they are available. The fastest way to get your results is to activate your My Chart account. Instructions are located on the last page of this paperwork. If you have not heard from [place] regarding the results in 2 weeks, please contact this office.      Signed,   Meredith Staggers, MD Primary Care at [facility].  11/20/18 11:39 PM

## 2018-11-14 NOTE — Patient Instructions (Signed)
° ° ° °  If you have lab work done today you will be contacted with your lab results within the next 2 weeks.  If you have not heard from us then please contact us. The fastest way to get your results is to register for My Chart. ° ° °IF you received an x-ray today, you will receive an invoice from [facility]. Please contact [facility] at [phone] with questions or concerns regarding your invoice.  ° °IF you received labwork today, you will receive an invoice from [facility]. Please contact [facility] at [phone] with questions or concerns regarding your invoice.  ° °Our billing staff will not be able to assist you with questions regarding bills from these companies. ° °You will be contacted with the lab results as soon as they are available. The fastest way to get your results is to activate your My Chart account. Instructions are located on the last page of this paperwork. If you have not heard from us regarding the results in 2 weeks, please contact this office. °  ° ° ° °

## 2018-11-20 ENCOUNTER — Encounter: Payer: Self-pay | Admitting: Family Medicine

## 2019-01-01 ENCOUNTER — Other Ambulatory Visit: Payer: Self-pay | Admitting: Family Medicine

## 2019-01-01 DIAGNOSIS — F32A Depression, unspecified: Secondary | ICD-10-CM

## 2019-01-01 DIAGNOSIS — I10 Essential (primary) hypertension: Secondary | ICD-10-CM

## 2019-01-01 DIAGNOSIS — F329 Major depressive disorder, single episode, unspecified: Secondary | ICD-10-CM

## 2019-03-01 ENCOUNTER — Other Ambulatory Visit: Payer: Self-pay | Admitting: Emergency Medicine

## 2019-03-07 ENCOUNTER — Other Ambulatory Visit: Payer: Self-pay

## 2019-03-07 DIAGNOSIS — Z13220 Encounter for screening for lipoid disorders: Secondary | ICD-10-CM

## 2019-03-07 DIAGNOSIS — I10 Essential (primary) hypertension: Secondary | ICD-10-CM

## 2019-03-13 ENCOUNTER — Other Ambulatory Visit: Payer: Self-pay

## 2019-03-13 ENCOUNTER — Telehealth (INDEPENDENT_AMBULATORY_CARE_PROVIDER_SITE_OTHER): Payer: Commercial Managed Care - PPO | Admitting: Family Medicine

## 2019-03-13 DIAGNOSIS — H5789 Other specified disorders of eye and adnexa: Secondary | ICD-10-CM | POA: Diagnosis not present

## 2019-03-13 MED ORDER — CIPROFLOXACIN HCL 0.3 % OP OINT: TOPICAL_OINTMENT | Freq: Three times a day (TID) | OPHTHALMIC | 0 refills | Status: DC

## 2019-03-13 MED ORDER — CIPROFLOXACIN HCL 0.3 % OP OINT
TOPICAL_OINTMENT | Freq: Three times a day (TID) | OPHTHALMIC | 0 refills | Status: DC
Start: 2019-03-13 — End: 2019-03-13

## 2019-03-13 NOTE — Progress Notes (Signed)
Pt is having a problem in the left eye, says it is puffy, red and has discharge in the mornings when he wakes up. He has been using tobramycin in the eye. Has been having this problem for 1 wk.

## 2019-03-13 NOTE — Progress Notes (Signed)
Virtual Visit via Telephone Note  I connected with Cameron Chase on 03/13/19 at 8:45 AM by telephone and verified that I am speaking with the correct person using two identifiers.   I discussed the limitations, risks, security and privacy concerns of performing an evaluation and management service by telephone and the availability of in person appointments. I also discussed with the patient that there may be a patient responsible charge related to this service. The patient expressed understanding and agreed to proceed, consent obtained  Chief complaint: Left eye redness/swelling.   History of Present Illness:  Started 8-9 days ago. Bottom eyelid was puffy. Thought was stye - treated with warm compresses. Min improved. teledoc through work 9 days ago. Took picture and sent to provider. Prescribed tobramycin gtt -using 3-4 times per day along with compresses. Less last week, but 3-4 compresses per day.  Still with crusting in the morning, irritation during the day. Cloudy cream colored d/c. Very min improvement with drops. Blurry until compress used - then normal. Puffiness has improved, but still a little puffy. No fever, no facial rash/blisters or swelling. All seems to be concentrated on lower eyelid.  [place]  Eyeball slightly red, but same in both eyes with allergy symptoms.  Has not been using allergy drops - patanol.  Bottom lid still puffy.   Patient Active Problem List   Diagnosis Date Noted  . Hyperlipidemia 02/07/2016  . Erectile dysfunction 07/29/2012  . Benign hypertension 07/18/2012  . Depression 07/18/2012   Past Medical History:  Diagnosis Date  . Depression   . Hypertension    No past surgical history on file. No Known Allergies Prior to Admission medications   Medication Sig Start Date End Date Taking? Authorizing Provider  amLODipine (NORVASC) 5 MG tablet TAKE 1 TABLET(5 MG) BY MOUTH DAILY 01/02/19  Yes Shade FloodGreene, Janijah Symons R, MD   buPROPion [facility](WELLBUTRIN) 100 MG tablet TAKE 1 TABLET(100 MG) BY MOUTH DAILY 01/02/19  Yes Shade FloodGreene, Abhishek Levesque R, MD  hydrochlorothiazide (HYDRODIURIL) 25 MG tablet TAKE 1 TABLET BY MOUTH EVERY DAY 01/02/19  Yes Shade FloodGreene, Dugan Vanhoesen R, MD  losartan (COZAAR) 50 MG tablet TAKE 1 TABLET BY MOUTH EVERY DAY 01/02/19  Yes Shade FloodGreene, Dionicio Shelnutt R, MD  olopatadine (PATANOL) 0.1 % ophthalmic solution Place 1 drop into both eyes 2 (two) times daily. 10/08/15  Yes Peyton NajjarHopper, Migel H, MD  tadalafil (ADCIRCA/CIALIS) 20 MG tablet TK 1 T PO D PRN FOR ERECTILE DYSFUNCTION 11/14/18  Yes Shade FloodGreene, Katianna Mcclenney R, MD   Social History   Socioeconomic History  . Marital status: Divorced    Spouse name: Not on file  . Number of children: Not on file  . Years of education: Not on file  . Highest education level: Not on file  Occupational History  . Not on file  Social Needs  . Financial resource strain: Not on file  . Food insecurity:    Worry: Not on file    Inability: Not on file  . Transportation needs:    Medical: Not on file    Non-medical: Not on file  Tobacco Use  . Smoking status: Never Smoker  . Smokeless tobacco: Never Used  Substance and Sexual Activity  . Alcohol use: Yes  . Drug use: No  . Sexual activity: Not on file  Lifestyle  . Physical activity:    Days per week: Not on file    Minutes per session: Not on file  . Stress: Not on file  Relationships  .  Social connections:    Talks on phone: Not on file    Gets together: Not on file    Attends religious service: Not on file    Active member of club or organization: Not on file    Attends meetings of clubs or organizations: Not on file    Relationship status: Not on file  . Intimate partner violence:    Fear of current or ex partner: Not on file    Emotionally abused: Not on file    Physically abused: Not on file    Forced sexual activity: Not on file  Other Topics Concern  . Not on file  Social History Narrative  . Not on file      Observations/Objective: No distress on phone. Normal speech.   Assessment and Plan: Eye swelling, left - Plan: ciprofloxacin (CILOXAN) 0.3 % ophthalmic ointment, DISCONTINUED: ciprofloxacin (CILOXAN) 0.3 % ophthalmic ointment Description sounds like possible initial stye with possible secondary infection.  Minimal improvement with tobramycin, but has had some improvement and visual acuity is okay in between use of warm compresses.  Limitations of eval over phone were discussed.   - At this time can continue warm compresses, tobramycin, restart allergy drops to help with redness bilaterally, then if not improving next few days can start ciprofloxacin ointment.  If still no improvement or any worsening symptoms would recommend an office evaluation or evaluation with his eye care provider  Follow Up Instructions:  As above.   Patient Instructions  Good talking to you today.  Continue warm compresses 4 times per day, tobramycin okay for now, restart allergy eyedrops to help with redness of both eyes.  If not helping in the next couple of days, I did send in the Ciloxan ophthalmic ointment that can be used initially 3 times per day for the first 2 days, then decrease to twice per day for an additional 5 days.  If still not improving on that treatment would recommend in office evaluation or evaluation with your eye care provider.  Sooner if worse.     I discussed the assessment and treatment plan with the patient. The patient was provided an opportunity to ask questions and all were answered. The patient agreed with the plan and demonstrated an understanding of the instructions.   The patient was advised to call back or seek an in-person evaluation if the symptoms worsen or if the condition fails to improve as anticipated.  I provided 11 minutes of non-face-to-face time during this encounter.  Signed,   Meredith Staggers, MD Primary Care at [facility].  03/13/19

## 2019-03-13 NOTE — Patient Instructions (Signed)
Good talking to you today.  Continue warm compresses 4 times per day, tobramycin okay for now, restart allergy eyedrops to help with redness of both eyes.  If not helping in the next couple of days, I did send in the Ciloxan ophthalmic ointment that can be used initially 3 times per day for the first 2 days, then decrease to twice per day for an additional 5 days.  If still not improving on that treatment would recommend in office evaluation or evaluation with your eye care provider.  Sooner if worse.

## 2019-06-07 ENCOUNTER — Encounter: Payer: Self-pay | Admitting: Registered Nurse

## 2019-06-07 ENCOUNTER — Telehealth (INDEPENDENT_AMBULATORY_CARE_PROVIDER_SITE_OTHER): Payer: Commercial Managed Care - PPO | Admitting: Registered Nurse

## 2019-06-07 VITALS — Ht 75.0 in | Wt 271.0 lb

## 2019-06-07 DIAGNOSIS — J020 Streptococcal pharyngitis: Secondary | ICD-10-CM

## 2019-06-07 NOTE — Progress Notes (Signed)
Telemedicine Encounter- SOAP NOTE Established Patient  This telephone encounter was conducted with the patient's (or proxy's) verbal consent via audio telecommunications: yes  Patient was instructed to have this encounter in a suitably private space; and to only have persons present to whom they give permission to participate. In addition, patient identity was confirmed by use of name plus two identifiers (DOB and address).  I discussed the limitations, risks, security and privacy concerns of performing an evaluation and management service by telephone and the availability of in person appointments. I also discussed with the patient that there may be a patient responsible charge related to this service. The patient expressed understanding and agreed to proceed.  I spent a total of 15 minutes talking with the patient or their proxy.  Chief Complaint  Patient presents with  . Sore Throat    had covid test 6-30 has not recieved results yet/     Subjective   Cameron Chase is a 48 y.o. established patient. Telephone visit today for sore throat  HPI Pt states onset of symptoms was 05/28/19. [facility] for a COVID-19 test - results have still not been processed. He was then seen again on Sunday, 06/04/19, by a telemed service provided through his employer. At that time, he was started on Omnicef (cefdinir) 300mg  PO bid for 7 days. He states his symptoms have improved but not resolved.   He states that he has ongoing bilateral throat pain. 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 He denies swelling, dysphagia, fever, GI symptoms, SOB, CP. He states that his symptoms are constant in duration.    Patient Active Problem List   Diagnosis Date Noted  . Hyperlipidemia 02/07/2016  . Erectile dysfunction 07/29/2012  . Benign hypertension 07/18/2012  . Depression 07/18/2012    Past  Medical History:  Diagnosis Date  . Depression   . Hypertension     Current Outpatient Medications  Medication Sig Dispense Refill  . amLODipine (NORVASC) 5 MG tablet TAKE 1 TABLET(5 MG) BY MOUTH DAILY 90 tablet 0  . buPROPion (WELLBUTRIN) 100 MG tablet TAKE 1 TABLET(100 MG) BY MOUTH DAILY 90 tablet 1  . cefdinir (OMNICEF) 300 MG capsule Take 300 mg by mouth 2 (two) times daily.    . hydrochlorothiazide (HYDRODIURIL) 25 MG tablet TAKE 1 TABLET BY MOUTH EVERY DAY 90 tablet 0  . losartan (COZAAR) 50 MG tablet TAKE 1 TABLET BY MOUTH EVERY DAY 90 tablet 0  . olopatadine (PATANOL) 0.1 % ophthalmic solution Place 1 drop into both eyes 2 (two) times daily. 5 mL 12  . tadalafil (ADCIRCA/CIALIS) 20 MG tablet TK 1 T PO D PRN FOR ERECTILE DYSFUNCTION 10 tablet 5  . ciprofloxacin (CILOXAN) 0.3 % ophthalmic ointment Place into the left eye 3 (three) times daily. 1/2 inch ribbon to left eye TID for 2 days, THEN BID for 5 days. (correct dosing - disregard prior order). (Patient not taking: Reported on 06/07/2019) 3.5 g 0   No current facility-administered medications for this visit.     No Known Allergies  Social History   Socioeconomic History  . Marital status: Divorced    Spouse name: Not on file  . Number of children: Not on file  . Years of education: Not on file  . Highest education level: Not on file  Occupational History  . Not  on file  Social Needs  . Financial resource strain: Not on file  . Food insecurity    Worry: Not on file    Inability: Not on file  . Transportation needs    Medical: Not on file    Non-medical: Not on file  Tobacco Use  . Smoking status: Never Smoker  . Smokeless tobacco: Never Used  Substance and Sexual Activity  . Alcohol use: Yes  . Drug use: No  . Sexual activity: Not on file  Lifestyle  . Physical activity    Days per week: Not on file    Minutes per session: Not on file  . Stress: Not on file  Relationships  . Social Musicianconnections    Talks on  phone: Not on file    Gets together: Not on file    Attends religious service: Not on file    Active member of club or organization: Not on file    Attends meetings of clubs or organizations: Not on file    Relationship status: Not on file  . Intimate partner violence    Fear of current or ex partner: Not on file    Emotionally abused: Not on file    Physically abused: Not on file    Forced sexual activity: Not on file  Other Topics Concern  . Not on file  Social History Narrative  . Not on file    Review of Systems  Constitutional: Negative.  Negative for chills and fever.  HENT: Positive for sore throat.   Eyes: Negative.   Respiratory: Negative.  Negative for cough, sputum production and shortness of breath.   Cardiovascular: Negative.  Negative for chest pain.  Gastrointestinal: Negative.  Negative for abdominal pain, constipation, diarrhea and vomiting.  Genitourinary: Negative.   Musculoskeletal: Negative.  Negative for myalgias.  Skin: Negative.   Neurological: Negative.  Negative for headaches.  Endo/Heme/Allergies: Negative.   Psychiatric/Behavioral: Negative.     Objective   Vitals as reported by the patient: Today's Vitals   06/07/19 1011  Weight: 271 lb (122.9 kg)  Height: 6\' 3"  (1.905 m)    Cameron Chase was seen today for sore throat.  Diagnoses and all orders for this visit:  Acute streptococcal pharyngitis   PLAN  Patient encouraged to continue on Omnicef 300mg  PO bid and to finish the course of this antibiotic. At this time, no change in this therapy is warranted. An alternative agent may be tried if he does not experience total relief by the end of this course.  Discussed nonpharm and OTC symptom relief with this patient - lozenges, sprays, and OTC analgesics are all options for this patient.  Instructed to contact myself or PCP Dr. Neva SeatGreene on Monday, 06/12/19 with any changing or worsening symptoms.   Patient encouraged to call clinic with any  questions, comments, or concerns.   I discussed the assessment and treatment plan with the patient. The patient was provided an opportunity to ask questions and all were answered. The patient agreed with the plan and demonstrated an understanding of the instructions.   The patient was advised to call back or seek an in-person evaluation if the symptoms worsen or if the condition fails to improve as anticipated.  I provided 15 minutes of non-face-to-face time during this encounter.  Janeece Ageeichard Amol Domanski, NP  Primary [facility] at [facility]

## 2019-06-08 ENCOUNTER — Other Ambulatory Visit: Payer: Self-pay | Admitting: Family Medicine

## 2019-07-12 ENCOUNTER — Other Ambulatory Visit: Payer: Self-pay | Admitting: Family Medicine

## 2019-07-24 ENCOUNTER — Other Ambulatory Visit: Payer: Self-pay | Admitting: Family Medicine

## 2019-07-24 DIAGNOSIS — F329 Major depressive disorder, single episode, unspecified: Secondary | ICD-10-CM

## 2019-07-24 DIAGNOSIS — I10 Essential (primary) hypertension: Secondary | ICD-10-CM

## 2019-07-24 DIAGNOSIS — F32A Depression, unspecified: Secondary | ICD-10-CM

## 2019-07-24 NOTE — Telephone Encounter (Signed)
Patient is requesting a refill of the following medications: Requested Prescriptions   Pending Prescriptions Disp Refills  . buPROPion (WELLBUTRIN) 100 MG tablet [Pharmacy Med Name: buPROPion HCl 100 MG Oral Tablet] 90 tablet 0    Sig: Take 1 tablet by mouth once daily   Signed Prescriptions Disp Refills  . amLODipine (NORVASC) 5 MG tablet 90 tablet 0    Sig: Take 1 tablet by mouth once daily    Authorizing Provider: Carlota Raspberry, JEFFREY R    Ordering User: GADDY, DELORES P  . losartan (COZAAR) 50 MG tablet 90 tablet 0    Sig: Take 1 tablet by mouth once daily    Authorizing Provider: Carlota Raspberry, JEFFREY R    Ordering User: GADDY, DELORES P  . hydrochlorothiazide (HYDRODIURIL) 25 MG tablet 90 tablet 0    Sig: Take 1 tablet by mouth once daily    Authorizing Provider: Wendie Agreste    Ordering User: Leim Fabry    Date of patient request: 07/24/2019 Last office visit: 06/07/2019 telemedicine visit Date of last refill: 01/02/2019 Last refill amount: #90 with 1 refill Follow up time period per chart: n/a

## 2019-07-24 NOTE — Telephone Encounter (Signed)
Requested medication (s) are due for refill today: yes  Requested medication (s) are on the active medication list: yes  Last refill:  01/02/2019  Future visit scheduled: no  Notes to clinic: Left a vm for patient to call and schedule a follow up appointment. Per protocol unable to refill   Requested Prescriptions  Pending Prescriptions Disp Refills   amLODipine (NORVASC) 5 MG tablet [Pharmacy Med Name: amLODIPine Besylate 5 MG Oral Tablet] 90 tablet 0    Sig: Take 1 tablet by mouth once daily     Cardiovascular:  Calcium Channel Blockers Failed - 07/24/2019  1:42 PM      Failed - Valid encounter within last 6 months    Recent Outpatient Visits          8 months ago Essential hypertension   [facility], Asencion PartridgeJeffrey R, MD   1 year ago Annual physical exam   [facility], Asencion PartridgeJeffrey R, MD   3 years ago Sty, right   [facility] Hopper, Sandria Balesavid H, MD   5 years ago Ingrown toenail   [facility], [place]ather [place], [place]             Passed - Last BP in normal range    BP Readings from Last 1 Encounters:  11/14/18 126/77          losartan (COZAAR) 50 MG tablet [Pharmacy Med Name: Losartan Potassium 50 MG Oral Tablet] 90 tablet 0    Sig: Take 1 tablet by mouth once daily     Cardiovascular:  Angiotensin Receptor Blockers Failed - 07/24/2019  1:42 PM      Failed - Cr in normal range and within 180 days    Creatinine, Ser  Date Value Ref Range Status  07/07/2018 1.13 0.76 - 1.27 mg/dL Final         Failed - K in normal range and within 180 days    Potassium  Date Value Ref Range Status  07/07/2018 4.1 3.5 - 5.2 mmol/L Final         Failed - Valid encounter within last 6 months    Recent Outpatient Visits          8 months ago Essential hypertension   [facility], Asencion PartridgeJeffrey R, MD   1 year ago Annual physical exam   [facility], Asencion PartridgeJeffrey R, MD   3 years ago Sty, right   [facility]  Hopper, Sandria Balesavid H, MD   5 years ago Ingrown toenail   [facility], [place], [place]             Passed - Patient is not pregnant      Passed - Last BP in normal range    BP Readings from Last 1 Encounters:  11/14/18 126/77          hydrochlorothiazide (HYDRODIURIL) 25 MG tablet [Pharmacy Med Name: hydroCHLOROthiazide 25 MG Oral Tablet] 90 tablet 0    Sig: Take 1 tablet by mouth once daily     Cardiovascular: Diuretics - Thiazide Failed - 07/24/2019  1:42 PM      Failed - Ca in normal range and within 360 days    Calcium  Date Value Ref Range Status  07/07/2018 10.0 8.7 - 10.2 mg/dL Final         Failed - Cr in normal range and within 360 days    Creatinine,  Ser  Date Value Ref Range Status  07/07/2018 1.13 0.76 - 1.27 mg/dL Final         Failed - K in normal range and within 360 days    Potassium  Date Value Ref Range Status  07/07/2018 4.1 3.5 - 5.2 mmol/L Final         Failed - Na in normal range and within 360 days    Sodium  Date Value Ref Range Status  07/07/2018 139 134 - 144 mmol/L Final         Failed - Valid encounter within last 6 months    Recent Outpatient Visits          8 months ago Essential hypertension   [facility], Ranell Patrick, MD   1 year ago Annual physical exam   [facility], Ranell Patrick, MD   3 years ago Sty, right   Primary Care at [facility], Fenton Malling, MD   5 years ago Ingrown toenail   Primary Care at [facility], [place], [place]             Passed - Last BP in normal range    BP Readings from Last 1 Encounters:  11/14/18 126/77          buPROPion (WELLBUTRIN) 100 MG tablet [Pharmacy Med Name: buPROPion HCl 100 MG Oral Tablet] 90 tablet 0    Sig: Take 1 tablet by mouth once daily     Psychiatry: Antidepressants - bupropion Failed - 07/24/2019  1:42 PM      Failed - Valid encounter within last 6 months    Recent Outpatient Visits          8 months ago Essential  hypertension   [facility], Ranell Patrick, MD   1 year ago Annual physical exam   [facility], Ranell Patrick, MD   3 years ago Sty, right   Primary Care at [facility], Fenton Malling, MD   5 years ago Ingrown toenail   Primary Care at [facility], [place], [place]             Passed - Last BP in normal range    BP Readings from Last 1 Encounters:  11/14/18 126/77         Passed - Completed PHQ-2 or PHQ-9 in the last 360 days.

## 2019-07-24 NOTE — Telephone Encounter (Signed)
Appointment pending in a few days.  Refilled.

## 2019-07-26 ENCOUNTER — Ambulatory Visit: Payer: Commercial Managed Care - PPO | Admitting: Family Medicine

## 2019-07-27 ENCOUNTER — Telehealth (INDEPENDENT_AMBULATORY_CARE_PROVIDER_SITE_OTHER): Payer: Commercial Managed Care - PPO | Admitting: Family Medicine

## 2019-07-27 DIAGNOSIS — N529 Male erectile dysfunction, unspecified: Secondary | ICD-10-CM

## 2019-07-27 DIAGNOSIS — I10 Essential (primary) hypertension: Secondary | ICD-10-CM | POA: Diagnosis not present

## 2019-07-27 DIAGNOSIS — F32A Depression, unspecified: Secondary | ICD-10-CM

## 2019-07-27 DIAGNOSIS — F329 Major depressive disorder, single episode, unspecified: Secondary | ICD-10-CM

## 2019-07-27 MED ORDER — TADALAFIL 20 MG PO TABS: 10.0000 mg | ORAL_TABLET | ORAL | 5 refills | Status: DC | PRN

## 2019-07-27 MED ORDER — HYDROCHLOROTHIAZIDE 25 MG PO TABS: ORAL_TABLET | ORAL | 1 refills | Status: DC

## 2019-07-27 MED ORDER — AMLODIPINE BESYLATE 5 MG PO TABS: 5.0000 mg | ORAL_TABLET | Freq: Every day | ORAL | 1 refills | Status: DC

## 2019-07-27 MED ORDER — LOSARTAN POTASSIUM 50 MG PO TABS: ORAL_TABLET | ORAL | 1 refills | Status: DC

## 2019-07-27 MED ORDER — BUPROPION HCL 100 MG PO TABS: 100.0000 mg | ORAL_TABLET | Freq: Every day | ORAL | 1 refills | Status: DC

## 2019-07-27 NOTE — Patient Instructions (Addendum)
Please come in for fasting labs next week.  No med changes for now.   Recheck in  6 months for a physical.  Please let me know if there are questions in the meantime and take care.

## 2019-07-27 NOTE — Progress Notes (Signed)
Virtual Visit via Telephone Note  I connected with Cameron Chase on 07/27/19 at 5:35 PM by telephone and verified that I am speaking with the correct person using two identifiers.   I discussed the limitations, risks, security and privacy concerns of performing an evaluation and management service by telephone and the availability of in person appointments. I also discussed with the patient that there may be a patient responsible charge related to this service. The patient expressed understanding and agreed to proceed, consent obtained  Chief complaint:  HTN History of Present Illness: Cameron Chase is a 48 y.o. male  Hypertension: BP Readings from Last 3 Encounters:  11/14/18 126/77  07/07/18 130/83  01/29/17 158/99   Lab Results  Component Value Date   CREATININE 1.13 07/07/2018  home readings every few weeks - 120's/80's.  norvasc 5mg , hctz25mg  qd, losartan 50mg  qd.   Constitutional: Negative for fatigue and unexpected weight change.  Eyes: Negative for visual disturbance.  Respiratory: Negative for cough, chest tightness and shortness of breath.   Cardiovascular: Negative for chest pain, palpitations and leg swelling.  Gastrointestinal: Negative for abdominal pain and blood in stool.  Neurological: Negative for dizziness, light-headedness and headaches.    Depression:  Depression screen [facility] 2/9 07/27/2019 06/07/2019 03/13/2019 11/14/2018 07/07/2018  Decreased Interest 0 0 0 0 0  Down, Depressed, Hopeless 0 0 0 0 0  PHQ - 2 Score 0 0 0 0 0  feels like stable on 100mg  dose. Would like to continue same.   Erectile dysfunction: 2 Cialis per week - 1/2 to full pill. Working well at lower dose most of the time.  No vision change, no hearing changes, no chest pain with exertion.   Hyperlipidemia:  Lab Results  Component Value Date   CHOL 205 (H) 07/07/2018   HDL 53 07/07/2018   LDLCALC 120 (H) 07/07/2018   TRIG 162 (H) 07/07/2018   CHOLHDL 3.9 07/07/2018   Lab Results   Component Value Date   ALT 17 07/07/2018   AST 23 07/07/2018   ALKPHOS 68 07/07/2018   BILITOT 1.0 07/07/2018   The 10-year ASCVD risk score Mikey Bussing DC Jr., et al., 2013) is: 3.1%   Values used to calculate the score:     Age: 41 years     Sex: Male     Is Non-Hispanic African American: No     Diabetic: No     Tobacco smoker: No     Systolic Blood Pressure: 852 mmHg     Is BP treated: Yes     HDL Cholesterol: 53 mg/dL     Total Cholesterol: 205 mg/dL     Patient Active Problem List   Diagnosis Date Noted  . Hyperlipidemia 02/07/2016  . Erectile dysfunction 07/29/2012  . Benign hypertension 07/18/2012  . Depression 07/18/2012   Past Medical History:  Diagnosis Date  . Depression   . Hypertension    No past surgical history on file. No Known Allergies Prior to Admission medications   Medication Sig Start Date End Date Taking? Authorizing Provider  amLODipine (NORVASC) 5 MG tablet Take 1 tablet by mouth once daily 07/24/19  Yes Wendie Agreste, MD  buPROPion [facility]) 100 MG tablet Take 1 tablet by mouth once daily 07/24/19  Yes Wendie Agreste, MD  hydrochlorothiazide (HYDRODIURIL) 25 MG tablet Take 1 tablet by mouth once daily 07/24/19  Yes Wendie Agreste, MD  losartan (COZAAR) 50 MG tablet Take 1 tablet by mouth once daily 07/24/19  Yes Shade Flood, MD  olopatadine (PATANOL) 0.1 % ophthalmic solution Place 1 drop into both eyes 2 (two) times daily. 10/08/15  Yes Peyton Najjar, MD  tadalafil (CIALIS) 20 MG tablet TAKE 1 TABLET BY MOUTH ONCE DAILY AS NEEDED FOR ERECTILE DYSFUNCTION 07/12/19  Yes Shade Flood, MD   Social History   Socioeconomic History  . Marital status: Divorced    Spouse name: Not on file  . Number of children: Not on file  . Years of education: Not on file  . Highest education level: Not on file  Occupational History  . Not on file  Social Needs  . Financial resource strain: Not on file  . Food insecurity    Worry: Not on  file    Inability: Not on file  . Transportation needs    Medical: Not on file    Non-medical: Not on file  Tobacco Use  . Smoking status: Never Smoker  . Smokeless tobacco: Never Used  Substance and Sexual Activity  . Alcohol use: Yes  . Drug use: No  . Sexual activity: Not on file  Lifestyle  . Physical activity    Days per week: Not on file    Minutes per session: Not on file  . Stress: Not on file  Relationships  . Social Musician on phone: Not on file    Gets together: Not on file    Attends religious service: Not on file    Active member of club or organization: Not on file    Attends meetings of clubs or organizations: Not on file    Relationship status: Not on file  . Intimate partner violence    Fear of current or ex partner: Not on file    Emotionally abused: Not on file    Physically abused: Not on file    Forced sexual activity: Not on file  Other Topics Concern  . Not on file  Social History Narrative  . Not on file     Observations/Objective: No distress.  Home BP reading as above.  Appropriate responses, no distress, understanding expressed a plan with all questions answered.  Assessment and Plan: Erectile dysfunction, unspecified erectile dysfunction type - Plan: tadalafil (CIALIS) 20 MG tablet  -Stable with 10 mg dose, occasional 20 mg without any side effects.  Refill same.  Lowest effective dose and potential side effects/risks were discussed.  Essential hypertension - Plan: amLODipine (NORVASC) 5 MG tablet, hydrochlorothiazide (HYDRODIURIL) 25 MG tablet, losartan (COZAAR) 50 MG tablet  -Stable by home readings.  Continue same regimen.  Plan is for fasting lab work next week.  Depression, unspecified depression type - Plan: buPROPion (WELLBUTRIN) 100 MG tablet  -Stable, continue same dose Wellbutrin.  Option of trial lower doses in the future, but chose to remain on the same dosing for now.  Follow Up Instructions: Lab visit next  week, 6 months physical Patient Instructions  Please come in for fasting labs next week.  No med changes for now.   Recheck in  6 months for a physical.  Please let me know if there are questions in the meantime and take care.        I discussed the assessment and treatment plan with the patient. The patient was provided an opportunity to ask questions and all were answered. The patient agreed with the plan and demonstrated an understanding of the instructions.   The patient was advised to call back or seek an in-person  evaluation if the symptoms worsen or if the condition fails to improve as anticipated.  I provided 11 minutes of non-face-to-face time during this encounter.  Signed,   Meredith StaggersJeffrey Justo Hengel, MD [facility] at [facility].  07/27/19

## 2019-07-27 NOTE — Progress Notes (Signed)
CC- Hypertention f/u- Patient do not need a refill on meds at time time. They already refilled. Today is just for f/u

## 2019-08-01 ENCOUNTER — Other Ambulatory Visit: Payer: Self-pay

## 2019-08-01 ENCOUNTER — Ambulatory Visit (INDEPENDENT_AMBULATORY_CARE_PROVIDER_SITE_OTHER): Payer: Commercial Managed Care - PPO | Admitting: Family Medicine

## 2019-08-01 DIAGNOSIS — I10 Essential (primary) hypertension: Secondary | ICD-10-CM | POA: Diagnosis not present

## 2019-08-01 DIAGNOSIS — Z13220 Encounter for screening for lipoid disorders: Secondary | ICD-10-CM

## 2019-08-02 LAB — LIPID PANEL
Chol/HDL Ratio: 3.8 ratio (ref 0.0–5.0)
Cholesterol, Total: 190 mg/dL (ref 100–199)
HDL: 50 mg/dL (ref >39)
LDL Chol Calc (NIH): 112 mg/dL — ABNORMAL HIGH (ref 0–99)
Triglycerides: 157 mg/dL — ABNORMAL HIGH (ref 0–149)
VLDL Cholesterol Cal: 28 mg/dL (ref 5–40)

## 2019-08-02 LAB — COMPREHENSIVE METABOLIC PANEL
ALT: 16 IU/L (ref 0–44)
AST: 21 IU/L (ref 0–40)
Albumin/Globulin Ratio: 2.2 (ref 1.2–2.2)
Albumin: 4.7 g/dL (ref 4.0–5.0)
Alkaline Phosphatase: 68 IU/L (ref 39–117)
BUN/Creatinine Ratio: 15 (ref 9–20)
BUN: 14 mg/dL (ref 6–24)
Bilirubin Total: 0.6 mg/dL (ref 0.0–1.2)
CO2: 23 mmol/L (ref 20–29)
Calcium: 9.6 mg/dL (ref 8.7–10.2)
Chloride: 102 mmol/L (ref 96–106)
Creatinine, Ser: 0.93 mg/dL (ref 0.76–1.27)
GFR calc Af Amer: 112 mL/min/{1.73_m2} (ref >59)
GFR calc non Af Amer: 97 mL/min/{1.73_m2} (ref >59)
Globulin, Total: 2.1 g/dL (ref 1.5–4.5)
Glucose: 92 mg/dL (ref 65–99)
Potassium: 4.2 mmol/L (ref 3.5–5.2)
Sodium: 140 mmol/L (ref 134–144)
Total Protein: 6.8 g/dL (ref 6.0–8.5)

## 2019-08-14 ENCOUNTER — Encounter: Payer: Self-pay | Admitting: Family Medicine

## 2019-08-16 ENCOUNTER — Ambulatory Visit (INDEPENDENT_AMBULATORY_CARE_PROVIDER_SITE_OTHER): Payer: Commercial Managed Care - PPO

## 2019-08-16 ENCOUNTER — Ambulatory Visit (INDEPENDENT_AMBULATORY_CARE_PROVIDER_SITE_OTHER): Payer: Commercial Managed Care - PPO | Admitting: Family Medicine

## 2019-08-16 ENCOUNTER — Encounter: Payer: Self-pay | Admitting: Family Medicine

## 2019-08-16 ENCOUNTER — Other Ambulatory Visit: Payer: Self-pay

## 2019-08-16 VITALS — BP 121/85 | HR 98 | Temp 98.1°F | Resp 14 | Wt 274.0 lb

## 2019-08-16 DIAGNOSIS — Z23 Encounter for immunization: Secondary | ICD-10-CM | POA: Diagnosis not present

## 2019-08-16 DIAGNOSIS — R0781 Pleurodynia: Secondary | ICD-10-CM | POA: Diagnosis not present

## 2019-08-16 DIAGNOSIS — R109 Unspecified abdominal pain: Secondary | ICD-10-CM | POA: Diagnosis not present

## 2019-08-16 LAB — POCT URINALYSIS DIP (MANUAL ENTRY)
Bilirubin, UA: NEGATIVE
Blood, UA: NEGATIVE
Glucose, UA: NEGATIVE mg/dL
Leukocytes, UA: NEGATIVE
Nitrite, UA: NEGATIVE
Protein Ur, POC: NEGATIVE mg/dL
Spec Grav, UA: 1.02 (ref 1.010–1.025)
Urobilinogen, UA: 0.2 E.U./dL
pH, UA: 6 (ref 5.0–8.0)

## 2019-08-16 MED ORDER — MELOXICAM 7.5 MG PO TABS: 7.5000 mg | ORAL_TABLET | Freq: Every day | ORAL | 0 refills | Status: DC

## 2019-08-16 NOTE — Patient Instructions (Addendum)
No sign of blood in the urine test today.  Based on the reproducible pain or pain with me pushing on that area, I suspect this is chest wall pain.  If there are any concerns on your x-ray I will let you know.  For now can try meloxicam once per day for the next few weeks, see information below.  As pain improves can stop medication.  If not improving in the next 2 to 3 weeks at the most, follow-up to recheck and discuss further  Return to the clinic or go to the nearest emergency room if any of your symptoms worsen or new symptoms occur.  Chest Wall Pain Chest wall pain is pain in or around the bones and muscles of your chest. Sometimes, an injury causes this pain. Excessive coughing or overuse of arm and chest muscles may also cause chest wall pain. Sometimes, the cause may not be known. This pain may take several weeks or longer to get better. Follow these instructions at home: Managing pain, stiffness, and swelling   If directed, put ice on the painful area: ? Put ice in a plastic bag. ? Place a towel between your skin and the bag. ? Leave the ice on for 20 minutes, 2-3 times per day. Activity  Rest as told by your health care provider.  Avoid activities that cause pain. These include any activities that use your chest muscles or your abdominal and side muscles to lift heavy items. Ask your health care provider what activities are safe for you. General instructions   Take over-the-counter and prescription medicines only as told by your health care provider.  Do not use any products that contain nicotine or tobacco, such as cigarettes, e-cigarettes, and chewing tobacco. These can delay healing after injury. If you need help quitting, ask your health care provider.  Keep all follow-up visits as told by your health care provider. This is important. Contact a health care provider if:  You have a fever.  Your chest pain becomes worse.  You have new symptoms. Get help right away  if:  You have nausea or vomiting.  You feel sweaty or light-headed.  You have a cough with mucus from your lungs (sputum) or you cough up blood.  You develop shortness of breath. These symptoms may represent a serious problem that is an emergency. Do not wait to see if the symptoms will go away. Get medical help right away. Call your local emergency services (911 in the [place] Do not drive yourself to the hospital. Summary  Chest wall pain is pain in or around the bones and muscles of your chest.  Depending on the cause, it may be treated with ice, rest, medicines, and avoiding activities that cause pain.  Contact a health care provider if you have a fever, worsening chest pain, or new symptoms.  Get help right away if you feel light-headed or you develop shortness of breath. These symptoms may be an emergency. This information is not intended to replace advice given to you by your health care provider. Make sure you discuss any questions you have with your health care provider. Document Released: 11/16/2005 Document Revised: 05/19/2018 Document Reviewed: 05/19/2018 Elsevier Patient Education  [employer]2020 [employer]   If you have lab work done today you will be contacted with your lab results within the next 2 weeks.  If you have not heard from us then please contact us. The fastest way to get your results is to register for My Chart.  IF you received an x-ray today, you will receive an invoice from [facility]. Please contact [facility] at [phone] with questions or concerns regarding your invoice.   IF you received labwork today, you will receive an invoice from [place]. Please contact [facility] at [phone] with questions or concerns regarding your invoice.   Our billing staff will not be able to assist you with questions regarding bills from these companies.  You will be contacted with the lab results as soon as they are available. The fastest way to get  your results is to activate your My Chart account. Instructions are located on the last page of this paperwork. If you have not heard from [place] regarding the results in 2 weeks, please contact this office.

## 2019-08-16 NOTE — Progress Notes (Signed)
Subjective:    Patient ID: [name], male    DOB: 14-Jul-1971, 48 y.o.   MRN: [id number]  HPI Cameron Chase is a 48 y.o. male Presents today for: Chief Complaint  Patient presents with  . Flank Pain    left side flank plank pain that comes and go. Pain woke me up one night . Pain only last a short amount of time. Worked out this am and did not bother me at all    L flank pain: Started about a month ago - thought was pulled muscle, but NKI. No new exercise. Lambert Mody feeling  - lasts few mins at a time - up to 15 mins. Woke up out of sleep last night. No hematuria, no dysuria/frequency.  No prior nephrolith.  BM's qd - normal.  No abd pain, no fever.  Tx: alleve once- helped.   No pain with exercise - worked out this am no problem.    Patient Active Problem List   Diagnosis Date Noted  . Hyperlipidemia 02/07/2016  . Erectile dysfunction 07/29/2012  . Benign hypertension 07/18/2012  . Depression 07/18/2012   Past Medical History:  Diagnosis Date  . Depression   . Hypertension    No past surgical history on file. No Known Allergies Prior to Admission medications   Medication Sig Start Date End Date Taking? Authorizing Provider  amLODipine (NORVASC) 5 MG tablet Take 1 tablet (5 mg total) by mouth daily. 07/27/19   Shade Flood, MD  buPROPion (WELLBUTRIN) 100 MG tablet Take 1 tablet (100 mg total) by mouth daily. 07/27/19   Shade Flood, MD  hydrochlorothiazide (HYDRODIURIL) 25 MG tablet Take 1 tablet by mouth once daily 07/27/19   Shade Flood, MD  losartan (COZAAR) 50 MG tablet Take 1 tablet by mouth once daily 07/27/19   Shade Flood, MD  olopatadine (PATANOL) 0.1 % ophthalmic solution Place 1 drop into both eyes 2 (two) times daily. 10/08/15   Peyton Najjar, MD  tadalafil (CIALIS) 20 MG tablet Take 0.5-1 tablets (10-20 mg total) by mouth every other day as needed for erectile dysfunction. 07/27/19   Shade Flood, MD   Social History    Socioeconomic History  . Marital status: Divorced    Spouse name: Not on file  . Number of children: Not on file  . Years of education: Not on file  . Highest education level: Not on file  Occupational History  . Not on file  Social Needs  . Financial resource strain: Not on file  . Food insecurity    Worry: Not on file    Inability: Not on file  . Transportation needs    Medical: Not on file    Non-medical: Not on file  Tobacco Use  . Smoking status: Never Smoker  . Smokeless tobacco: Never Used  Substance and Sexual Activity  . Alcohol use: Yes  . Drug use: No  . Sexual activity: Not on file  Lifestyle  . Physical activity    Days per week: Not on file    Minutes per session: Not on file  . Stress: Not on file  Relationships  . Social Musician on phone: Not on file    Gets together: Not on file    Attends religious service: Not on file    Active member of club or organization: Not on file    Attends meetings of clubs or organizations: Not on file  Relationship status: Not on file  . Intimate partner violence    Fear of current or ex partner: Not on file    Emotionally abused: Not on file    Physically abused: Not on file    Forced sexual activity: Not on file  Other Topics Concern  . Not on file  Social History Narrative  . Not on file    Review of Systems Per HPI.     Objective:   Physical Exam Vitals signs reviewed.  Constitutional:      Appearance: He is well-developed.  HENT:     Head: Normocephalic and atraumatic.  Eyes:     Pupils: Pupils are equal, round, and reactive to light.  Neck:     Vascular: No carotid bruit or JVD.  Cardiovascular:     Rate and Rhythm: Normal rate and regular rhythm.     Heart sounds: Normal heart sounds. No murmur.  Pulmonary:     Effort: Pulmonary effort is normal.     Breath sounds: Normal breath sounds. No rales.  Chest:     Chest wall: Tenderness (lower left ribs, latissimus at posterior axillary  line. ) present. No deformity or swelling.  Abdominal:     General: Abdomen is flat. Bowel sounds are normal.     Tenderness: There is no abdominal tenderness. There is no guarding.  Skin:    General: Skin is warm and dry.  Neurological:     Mental Status: He is alert and oriented to person, place, and time.    Vitals:   08/16/19 0844  BP: 121/85  Pulse: 98  Resp: 14  Temp: 98.1 F (36.7 C)  TempSrc: Oral  SpO2: 97%  Weight: 274 lb (124.3 kg)   Results for orders placed or performed in visit on 08/16/19  POCT urinalysis dipstick  Result Value Ref Range   Color, UA yellow yellow   Clarity, UA clear clear   Glucose, UA negative negative mg/dL   Bilirubin, UA negative negative   Ketones, POC UA trace (5) (A) negative mg/dL   Spec Grav, UA 1.020 1.010 - 1.025   Blood, UA negative negative   pH, UA 6.0 5.0 - 8.0   Protein Ur, POC negative negative mg/dL   Urobilinogen, UA 0.2 0.2 or 1.0 E.U./dL   Nitrite, UA Negative Negative   Leukocytes, UA Negative Negative   No results found.     Assessment & Plan:    Cameron Chase is a 48 y.o. male Need for prophylactic vaccination and inoculation against influenza - Plan: Flu Vaccine QUAD 36+ mos IM  Flank pain - Plan: POCT urinalysis dipstick, DG Ribs Unilateral W/Chest Left, meloxicam (MOBIC) 7.5 MG tablet  Rib pain on left side - Plan: DG Ribs Unilateral W/Chest Left, meloxicam (MOBIC) 7.5 MG tablet  Reproducible on exam, suspected chest wall pain.  X-ray pending as above.  No sign of blood on urinalysis, no history of kidney stones, less likely cause.   Trial meloxicam 7.5 mg daily as needed for the next few weeks with RTC precautions.   Meds ordered this encounter  Medications  . meloxicam (MOBIC) 7.5 MG tablet    Sig: Take 1 tablet (7.5 mg total) by mouth daily.    Dispense:  20 tablet    Refill:  0   Patient Instructions    No sign of blood in the urine test today.  Based on the reproducible pain or pain with  me pushing on that area, I suspect this is  chest wall pain.  If there are any concerns on your x-ray I will let you know.  For now can try meloxicam once per day for the next few weeks, see information below.  As pain improves can stop medication.  If not improving in the next 2 to 3 weeks at the most, follow-up to recheck and discuss further  Return to the clinic or go to the nearest emergency room if any of your symptoms worsen or new symptoms occur.  Chest Wall Pain Chest wall pain is pain in or around the bones and muscles of your chest. Sometimes, an injury causes this pain. Excessive coughing or overuse of arm and chest muscles may also cause chest wall pain. Sometimes, the cause may not be known. This pain may take several weeks or longer to get better. Follow these instructions at home: Managing pain, stiffness, and swelling   If directed, put ice on the painful area: ? Put ice in a plastic bag. ? Place a towel between your skin and the bag. ? Leave the ice on for 20 minutes, 2-3 times per day. Activity  Rest as told by your health care provider.  Avoid activities that cause pain. These include any activities that use your chest muscles or your abdominal and side muscles to lift heavy items. Ask your health care provider what activities are safe for you. General instructions   Take over-the-counter and prescription medicines only as told by your health care provider.  Do not use any products that contain nicotine or tobacco, such as cigarettes, e-cigarettes, and chewing tobacco. These can delay healing after injury. If you need help quitting, ask your health care provider.  Keep all follow-up visits as told by your health care provider. This is important. Contact a health care provider if:  You have a fever.  Your chest pain becomes worse.  You have new symptoms. Get help right away if:  You have nausea or vomiting.  You feel sweaty or light-headed.  You have a cough  with mucus from your lungs (sputum) or you cough up blood.  You develop shortness of breath. These symptoms may represent a serious problem that is an emergency. Do not wait to see if the symptoms will go away. Get medical help right away. Call your local emergency services (911 in the [place] Do not drive yourself to the hospital. Summary  Chest wall pain is pain in or around the bones and muscles of your chest.  Depending on the cause, it may be treated with ice, rest, medicines, and avoiding activities that cause pain.  Contact a health care provider if you have a fever, worsening chest pain, or new symptoms.  Get help right away if you feel light-headed or you develop shortness of breath. These symptoms may be an emergency. This information is not intended to replace advice given to you by your health care provider. Make sure you discuss any questions you have with your health care provider. Document Released: 11/16/2005 Document Revised: 05/19/2018 Document Reviewed: 05/19/2018 Elsevier Patient Education  [employer]2020 [employer]   If you have lab work done today you will be contacted with your lab results within the next 2 weeks.  If you have not heard from us then please contact us. The fastest way to get your results is to register for My Chart.   IF you received an x-ray today, you will receive an invoice from [facility]. Please contact [facility] at [phone] with questions or concerns regarding  your invoice.   IF you received labwork today, you will receive an invoice from [facility]. Please contact [facility] at [phone] with questions or concerns regarding your invoice.   Our billing staff will not be able to assist you with questions regarding bills from these companies.  You will be contacted with the lab results as soon as they are available. The fastest way to get your results is to activate your My Chart account. Instructions are located on the last page  of this paperwork. If you have not heard from us regarding the results in 2 weeks, please contact this office.       Signed,   Meredith StaggersJeffrey Sylvanna Burggraf, MD Primary Care at [facility].  08/16/19 9:53 AM

## 2019-11-08 ENCOUNTER — Other Ambulatory Visit: Payer: Self-pay

## 2019-11-08 DIAGNOSIS — Z20822 Contact with and (suspected) exposure to covid-19: Secondary | ICD-10-CM

## 2019-11-10 LAB — NOVEL CORONAVIRUS, NAA: SARS-CoV-2, NAA: NOT DETECTED

## 2019-11-15 ENCOUNTER — Other Ambulatory Visit: Payer: Commercial Managed Care - PPO

## 2019-11-15 ENCOUNTER — Other Ambulatory Visit: Payer: Self-pay

## 2019-11-15 ENCOUNTER — Ambulatory Visit: Payer: Commercial Managed Care - PPO | Attending: Internal Medicine

## 2019-11-15 DIAGNOSIS — Z20822 Contact with and (suspected) exposure to covid-19: Secondary | ICD-10-CM

## 2019-11-17 LAB — NOVEL CORONAVIRUS, NAA: SARS-CoV-2, NAA: NOT DETECTED

## 2020-01-31 ENCOUNTER — Encounter: Payer: Self-pay | Admitting: Family Medicine

## 2020-01-31 ENCOUNTER — Ambulatory Visit (INDEPENDENT_AMBULATORY_CARE_PROVIDER_SITE_OTHER): Payer: Commercial Managed Care - PPO | Admitting: Family Medicine

## 2020-01-31 ENCOUNTER — Other Ambulatory Visit: Payer: Self-pay

## 2020-01-31 VITALS — BP 123/81 | HR 77 | Temp 98.0°F | Ht 75.0 in | Wt 277.0 lb

## 2020-01-31 DIAGNOSIS — Z0001 Encounter for general adult medical examination with abnormal findings: Secondary | ICD-10-CM | POA: Diagnosis not present

## 2020-01-31 DIAGNOSIS — F329 Major depressive disorder, single episode, unspecified: Secondary | ICD-10-CM | POA: Diagnosis not present

## 2020-01-31 DIAGNOSIS — F32A Depression, unspecified: Secondary | ICD-10-CM

## 2020-01-31 DIAGNOSIS — I10 Essential (primary) hypertension: Secondary | ICD-10-CM | POA: Diagnosis not present

## 2020-01-31 DIAGNOSIS — Z125 Encounter for screening for malignant neoplasm of prostate: Secondary | ICD-10-CM

## 2020-01-31 DIAGNOSIS — B353 Tinea pedis: Secondary | ICD-10-CM

## 2020-01-31 DIAGNOSIS — Z0000 Encounter for general adult medical examination without abnormal findings: Secondary | ICD-10-CM

## 2020-01-31 DIAGNOSIS — N529 Male erectile dysfunction, unspecified: Secondary | ICD-10-CM | POA: Diagnosis not present

## 2020-01-31 MED ORDER — BUPROPION HCL 100 MG PO TABS: 100.0000 mg | ORAL_TABLET | Freq: Every day | ORAL | 1 refills | Status: DC

## 2020-01-31 MED ORDER — AMLODIPINE BESYLATE 5 MG PO TABS: 5.0000 mg | ORAL_TABLET | Freq: Every day | ORAL | 1 refills | Status: DC

## 2020-01-31 MED ORDER — CLOTRIMAZOLE 1 % EX CREA: 1.0000 "application " | TOPICAL_CREAM | Freq: Two times a day (BID) | CUTANEOUS | 0 refills | Status: DC

## 2020-01-31 MED ORDER — HYDROCHLOROTHIAZIDE 25 MG PO TABS: ORAL_TABLET | ORAL | 1 refills | Status: DC

## 2020-01-31 MED ORDER — LOSARTAN POTASSIUM 50 MG PO TABS: ORAL_TABLET | ORAL | 1 refills | Status: DC

## 2020-01-31 MED ORDER — TADALAFIL 20 MG PO TABS: 10.0000 mg | ORAL_TABLET | ORAL | 5 refills | Status: DC | PRN

## 2020-01-31 NOTE — Progress Notes (Signed)
Subjective:  Patient ID: Cameron Chase, male    DOB: [birth year]  Age: 49 y.o. MRN: [id number]  CC:  Chief Complaint  Patient presents with  . Annual Exam    pt states he feels good today with no complaints. pt hasn't had any issues with his hypertension. pt checks his BP once a month. pt hasn't had any physical symptoms of hypertension. pt reports his medication works well with no side effects.    HPI Cameron Chase presents for  Annual physical exam, med refills  Hypertension: Amlodipine 5 mg daily, hydrochlorothiazide 25 mg daily, losartan 50 mg daily Home readings: 130/90 no new side effects of meds.  BP Readings from Last 3 Encounters:  01/31/20 123/81  08/16/19 121/85  11/14/18 126/77   Lab Results  Component Value Date   CREATININE 0.93 08/01/2019    Depression: Wellbutrin 100 mg daily.  Managing sx's well. No new side effects.  Depression screen [facility] 2/9 01/31/2020 08/16/2019 07/27/2019 06/07/2019 03/13/2019  Decreased Interest 0 0 0 0 0  Down, Depressed, Hopeless 0 0 0 0 0  PHQ - 2 Score 0 0 0 0 0   Hyperlipidemia: Mild elevated LDL previously.  Diet/exercise approach initially planned. Lab Results  Component Value Date   CHOL 190 08/01/2019   HDL 50 08/01/2019   LDLCALC 112 (H) 08/01/2019   TRIG 157 (H) 08/01/2019   CHOLHDL 3.8 08/01/2019   Lab Results  Component Value Date   ALT 16 08/01/2019   AST 21 08/01/2019   ALKPHOS 68 08/01/2019   BILITOT 0.6 08/01/2019   Has been exercising more - 2-3 days per week. Some improvement in diet - could be better.  Wt Readings from Last 3 Encounters:  01/31/20 277 lb (125.6 kg)  08/16/19 274 lb (124.3 kg)  06/07/19 271 lb (122.9 kg)   Erectile dysfunction: cialis at 10mg  dosing working well. No new vision/hearing changes or CP with exertion.     Cancer screening No family history of colon cancer.  Prostate testing discussed including risks and benefits, concept of over testing versus overtreatment, possible false  positives, false negatives. No FH of prostate CA. Requests PSA only. Limitations without DRE discussed.   Immunization History  Administered Date(s) Administered  . Influenza,inj,Quad PF,6+ Mos 10/08/2015, 11/14/2018, 08/16/2019  . Tdap 11/14/2018  covid - plans to have when available.    Hearing Screening   125Hz  250Hz  500Hz  1000Hz  2000Hz  3000Hz  4000Hz  6000Hz  8000Hz   Right ear:           Left ear:             Visual Acuity Screening   Right eye Left eye Both eyes  Without correction: 20/20 20/20 20/15   With correction:      Dental: every 6 months.   Exercise: 2-3 days per week.     History Patient Active Problem List   Diagnosis Date Noted  . Hyperlipidemia 02/07/2016  . Erectile dysfunction 07/29/2012  . Benign hypertension 07/18/2012  . Depression 07/18/2012   Past Medical History:  Diagnosis Date  . Depression   . Hypertension    No past surgical history on file. No Known Allergies Prior to Admission medications   Medication Sig Start Date End Date Taking? Authorizing Provider  amLODipine (NORVASC) 5 MG tablet Take 1 tablet (5 mg total) by mouth daily. 07/27/19  Yes Wendie Agreste, MD  buPROPion (WELLBUTRIN) 100 MG tablet Take 1 tablet (100 mg total) by mouth daily. 07/27/19  Yes Merri Ray  R, MD  hydrochlorothiazide (HYDRODIURIL) 25 MG tablet Take 1 tablet by mouth once daily 07/27/19  Yes Shade Flood, MD  losartan (COZAAR) 50 MG tablet Take 1 tablet by mouth once daily 07/27/19  Yes Shade Flood, MD  meloxicam (MOBIC) 7.5 MG tablet Take 1 tablet (7.5 mg total) by mouth daily. 08/16/19  Yes Shade Flood, MD  olopatadine (PATANOL) 0.1 % ophthalmic solution Place 1 drop into both eyes 2 (two) times daily. 10/08/15  Yes Peyton Najjar, MD  tadalafil (CIALIS) 20 MG tablet Take 0.5-1 tablets (10-20 mg total) by mouth every other day as needed for erectile dysfunction. 07/27/19  Yes Shade Flood, MD   Social History   Socioeconomic History  .  Marital status: Divorced    Spouse name: Not on file  . Number of children: Not on file  . Years of education: Not on file  . Highest education level: Not on file  Occupational History  . Not on file  Tobacco Use  . Smoking status: Never Smoker  . Smokeless tobacco: Never Used  Substance and Sexual Activity  . Alcohol use: Yes  . Drug use: No  . Sexual activity: Not on file  Other Topics Concern  . Not on file  Social History Narrative  . Not on file   Social Determinants of Health   Financial Resource Strain:   . Difficulty of Paying Living Expenses: Not on file  Food Insecurity:   . Worried About Programme researcher, broadcasting/film/video in the Last Year: Not on file  . Ran Out of Food in the Last Year: Not on file  Transportation Needs:   . Lack of Transportation (Medical): Not on file  . Lack of Transportation (Non-Medical): Not on file  Physical Activity:   . Days of Exercise per Week: Not on file  . Minutes of Exercise per Session: Not on file  Stress:   . Feeling of Stress : Not on file  Social Connections:   . Frequency of Communication with Friends and Family: Not on file  . Frequency of Social Gatherings with Friends and Family: Not on file  . Attends Religious Services: Not on file  . Active Member of Clubs or Organizations: Not on file  . Attends Banker Meetings: Not on file  . Marital Status: Not on file  Intimate Partner Violence:   . Fear of Current or Ex-Partner: Not on file  . Emotionally Abused: Not on file  . Physically Abused: Not on file  . Sexually Abused: Not on file    Review of Systems  13 point review of systems per patient health survey noted.  Negative other than as indicated above or in HPI.   Objective:   Vitals:   01/31/20 0807  BP: 123/81  Pulse: 77  Temp: 98 F (36.7 C)  TempSrc: Temporal  SpO2: 97%  Weight: 277 lb (125.6 kg)  Height: 6\' 3"  (1.905 m)     Physical Exam Vitals reviewed.  Constitutional:      Appearance: He  is well-developed.  HENT:     Head: Normocephalic and atraumatic.     Right Ear: External ear normal.     Left Ear: External ear normal.  Eyes:     Conjunctiva/sclera: Conjunctivae normal.     Pupils: Pupils are equal, round, and reactive to light.  Neck:     Thyroid: No thyromegaly.  Cardiovascular:     Rate and Rhythm: Normal rate and regular rhythm.  Heart sounds: Normal heart sounds.  Pulmonary:     Effort: Pulmonary effort is normal. No respiratory distress.     Breath sounds: Normal breath sounds. No wheezing.  Abdominal:     General: There is no distension.     Palpations: Abdomen is soft.     Tenderness: There is no abdominal tenderness.  Musculoskeletal:        General: No tenderness. Normal range of motion.     Cervical back: Normal range of motion and neck supple.       Feet:  Lymphadenopathy:     Cervical: No cervical adenopathy.  Skin:    General: Skin is warm and dry.  Neurological:     Mental Status: He is alert and oriented to person, place, and time.     Deep Tendon Reflexes: Reflexes are normal and symmetric.  Psychiatric:        Behavior: Behavior normal.     Assessment & Plan:  Cameron Chase is a 49 y.o. male . Annual physical exam  - -anticipatory guidance as below in AVS, screening labs above. Health maintenance items as above in HPI discussed/recommended as applicable.   -After risks/benefits of prostate cancer testing discussed, elected to have PSA drawn as above. Essential hypertension - Plan: amLODipine (NORVASC) 5 MG tablet, hydrochlorothiazide (HYDRODIURIL) 25 MG tablet, losartan (COZAAR) 50 MG tablet, Lipid panel, Comprehensive metabolic panel  -  Stable, tolerating current regimen. Medications refilled. Labs pending as above.   Depression, unspecified depression type - Plan: buPROPion (WELLBUTRIN) 100 MG tablet  -Stable, decided to remain on Wellbutrin.  If doing well in the future, option of tapering off to see if he still does well.   Elected to continue medications at this time.  Erectile dysfunction, unspecified erectile dysfunction type - Plan: tadalafil (CIALIS) 20 MG tablet  -Tadalafil Rx given - use lowest effective dose. Side effects discussed (including but not limited to headache/flushing, blue discoloration of vision, possible vascular steal and risk of cardiac effects if underlying unknown coronary artery disease, and permanent sensorineural hearing loss). Understanding expressed.  Tinea pedis of both feet - Plan: clotrimazole (LOTRIMIN) 1 % cream  -Suspect component of tinea pedis with what appears to be crusted vesicular lesions, dry and peeling skin.  Also with thick calluses.  Initial approach of clotrimazole, then consider podiatry for further foot care if needed.  No orders of the defined types were placed in this encounter.  Patient Instructions       If you have lab work done today you will be contacted with your lab results within the next 2 weeks.  If you have not heard from [place] then please contact us. The fastest way to get your results is to register for My Chart.   IF you received an x-ray today, you will receive an invoice from [facility]. Please contact [facility] at [phone] with questions or concerns regarding your invoice.   IF you received labwork today, you will receive an invoice from [place]. Please contact [facility] at [phone] with questions or concerns regarding your invoice.   Our billing staff will not be able to assist you with questions regarding bills from these companies.  You will be contacted with the lab results as soon as they are available. The fastest way to get your results is to activate your My Chart account. Instructions are located on the last page of this paperwork. If you have not heard from [place] regarding the results in 2 weeks, please contact  this office.         Signed, Meredith Staggers, MD [facility]

## 2020-01-31 NOTE — Patient Instructions (Addendum)
Try clotrimazole twice per day to feet as some of the areas appear to be more fungal.  If continued issues with calluses, would recommend follow-up with podiatry.  Let me know and I can place a referral.  No other medication changes today.  Try to increase days of exercise and watch diet for weight loss.  Recheck in 6 months   Keeping you healthy  Get these tests  Blood pressure- Have your blood pressure checked once a year by your healthcare provider.  Normal blood pressure is 120/80.  Weight- Have your body mass index (BMI) calculated to screen for obesity.  BMI is a measure of body fat based on height and weight. You can also calculate your own BMI at https://www.west-esparza.com/.  Cholesterol- Have your cholesterol checked regularly starting at age 81, sooner may be necessary if you have diabetes, high blood pressure, if a family member developed heart diseases at an early age or if you smoke.   Chlamydia, HIV, and other sexual transmitted disease- Get screened each year until the age of 36 then within three months of each new sexual partner.  Diabetes- Have your blood sugar checked regularly if you have high blood pressure, high cholesterol, a family history of diabetes or if you are overweight.  Get these vaccines  Flu shot- Every fall.  Tetanus shot- Every 10 years.  Menactra- Single dose; prevents meningitis.  Take these steps  Don't smoke- If you do smoke, ask your healthcare provider about quitting. For tips on how to quit, go to www.smokefree.gov or call 1-800-QUIT-NOW.  Be physically active- Exercise 5 days a week for at least 30 minutes.  If you are not already physically active start slow and gradually work up to 30 minutes of moderate physical activity.  Examples of moderate activity include walking briskly, mowing the yard, dancing, swimming bicycling, etc.  Eat a healthy diet- Eat a variety of healthy foods such as fruits, vegetables, low fat milk, low fat cheese,  yogurt, lean meats, poultry, fish, beans, tofu, etc.  For more information on healthy eating, go to www.thenutritionsource.org  Drink alcohol in moderation- Limit alcohol intake two drinks or less a day.  Never drink and drive.  Dentist- Brush and floss teeth twice daily; visit your dentis twice a year.  Depression-Your emotional health is as important as your physical health.  If you're feeling down, losing interest in things you normally enjoy please talk with your healthcare provider.  Gun Safety- If you keep a gun in your home, keep it unloaded and with the safety lock on.  Bullets should be stored separately.  Helmet use- Always wear a helmet when riding a motorcycle, bicycle, rollerblading or skateboarding.  Safe sex- If you may be exposed to a sexually transmitted infection, use a condom  Seat belts- Seat bels can save your life; always wear one.  Smoke/Carbon Monoxide detectors- These detectors need to be installed on the appropriate level of your home.  Replace batteries at least once a year.  Skin Cancer- When out in the sun, cover up and use sunscreen SPF 15 or higher.  Violence- If anyone is threatening or hurting you, please tell your healthcare provider.   Athlete's Foot  Athlete's foot (tinea pedis) is a fungal infection of the skin on your feet. It often occurs on the skin that is between or underneath the toes. It can also occur on the soles of your feet. The infection can spread from person to person (is contagious). It can also spread  when a person's bare feet come in contact with the fungus on shower floors or on items such as shoes. What are the causes? This condition is caused by a fungus that grows in warm, moist places. You can get athlete's foot by sharing shoes, shower stalls, towels, and wet floors with someone who is infected. Not washing your feet or changing your socks often enough can also lead to athlete's foot. What increases the risk? This condition is  more likely to develop in:  Men.  People who have a weak body defense system (immune system).  People who have diabetes.  People who use public showers, such as at a gym.  People who wear heavy-duty shoes, such as Youth worker.  Seasons with warm, humid weather. What are the signs or symptoms? Symptoms of this condition include:  Itchy areas between your toes or on the soles of your feet.  White, flaky, or scaly areas between your toes or on the soles of your feet.  Very itchy small blisters between your toes or on the soles of your feet.  Small cuts in your skin. These cuts can become infected.  Thick or discolored toenails. How is this diagnosed? This condition may be diagnosed with a physical exam and a review of your medical history. Your health care provider may also take a skin or toenail sample to examine under a microscope. How is this treated? This condition is treated with antifungal medicines. These may be applied as powders, ointments, or creams. In severe cases, an oral antifungal medicine may be given. Follow these instructions at home: Medicines  Apply or take over-the-counter and prescription medicines only as told by your health care provider.  Apply your antifungal medicine as told by your health care provider. Do not stop using the antifungal even if your condition improves. Foot care  Do not scratch your feet.  Keep your feet dry: ? Wear cotton or wool socks. Change your socks every day or if they become wet. ? Wear shoes that allow air to flow, such as sandals or canvas tennis shoes.  Wash and dry your feet, including the area between your toes. Also, wash and dry your feet: ? Every day or as told by your health care provider. ? After exercising. General instructions  Do not let others use towels, shoes, nail clippers, or other personal items that touch your feet.  Protect your feet by wearing sandals in wet areas, such as locker  rooms and shared showers.  Keep all follow-up visits as told by your health care provider. This is important.  If you have diabetes, keep your blood sugar under control. Contact a health care provider if:  You have a fever.  You have swelling, soreness, warmth, or redness in your foot.  Your feet are not getting better with treatment.  Your symptoms get worse.  You have new symptoms. Summary  Athlete's foot (tinea pedis) is a fungal infection of the skin on your feet. It often occurs on skin that is between or underneath the toes.  This condition is caused by a fungus that grows in warm, moist places.  Symptoms include white, flaky, or scaly areas between your toes or on the soles of your feet.  This condition is treated with antifungal medicines.  Keep your feet clean. Always dry them thoroughly. This information is not intended to replace advice given to you by your health care provider. Make sure you discuss any questions you have with your health  care provider. Document Revised: 11/11/2017 Document Reviewed: 09/06/2017 Elsevier Patient Education  [employer].   If you have lab work done today you will be contacted with your lab results within the next 2 weeks.  If you have not heard from [place] then please contact us. The fastest way to get your results is to register for My Chart.   IF you received an x-ray today, you will receive an invoice from [facility]. Please contact [facility] at [phone] with questions or concerns regarding your invoice.   IF you received labwork today, you will receive an invoice from [place]. Please contact [facility] at [phone] with questions or concerns regarding your invoice.   Our billing staff will not be able to assist you with questions regarding bills from these companies.  You will be contacted with the lab results as soon as they are available. The fastest way to get your results is to activate your My  Chart account. Instructions are located on the last page of this paperwork. If you have not heard from [place] regarding the results in 2 weeks, please contact this office.

## 2020-02-01 LAB — COMPREHENSIVE METABOLIC PANEL
ALT: 13 IU/L (ref 0–44)
AST: 21 IU/L (ref 0–40)
Albumin/Globulin Ratio: 2.6 — ABNORMAL HIGH (ref 1.2–2.2)
Albumin: 4.9 g/dL (ref 4.0–5.0)
Alkaline Phosphatase: 69 IU/L (ref 39–117)
BUN/Creatinine Ratio: 15 (ref 9–20)
BUN: 14 mg/dL (ref 6–24)
Bilirubin Total: 0.5 mg/dL (ref 0.0–1.2)
CO2: 24 mmol/L (ref 20–29)
Calcium: 9.8 mg/dL (ref 8.7–10.2)
Chloride: 102 mmol/L (ref 96–106)
Creatinine, Ser: 0.93 mg/dL (ref 0.76–1.27)
GFR calc Af Amer: 112 mL/min/{1.73_m2} (ref >59)
GFR calc non Af Amer: 97 mL/min/{1.73_m2} (ref >59)
Globulin, Total: 1.9 g/dL (ref 1.5–4.5)
Glucose: 105 mg/dL — ABNORMAL HIGH (ref 65–99)
Potassium: 4.1 mmol/L (ref 3.5–5.2)
Sodium: 143 mmol/L (ref 134–144)
Total Protein: 6.8 g/dL (ref 6.0–8.5)

## 2020-02-01 LAB — LIPID PANEL
Chol/HDL Ratio: 3.8 ratio (ref 0.0–5.0)
Cholesterol, Total: 200 mg/dL — ABNORMAL HIGH (ref 100–199)
HDL: 53 mg/dL (ref >39)
LDL Chol Calc (NIH): 125 mg/dL — ABNORMAL HIGH (ref 0–99)
Triglycerides: 124 mg/dL (ref 0–149)
VLDL Cholesterol Cal: 22 mg/dL (ref 5–40)

## 2020-02-01 LAB — PSA: Prostate Specific Ag, Serum: 4.1 ng/mL — ABNORMAL HIGH (ref 0.0–4.0)

## 2020-02-03 ENCOUNTER — Ambulatory Visit: Payer: Commercial Managed Care - PPO

## 2020-07-19 IMAGING — DX DG RIBS W/ CHEST 3+V*L*
4 series · 4 of 4 positions shown · non-contrast
Comparison: Chest x-ray dated October 28, 2008.

CLINICAL DATA: Left-sided lower rib/flank pain for the past month.

EXAM:
LEFT RIBS AND CHEST - 3+ VIEW

[chest pa]
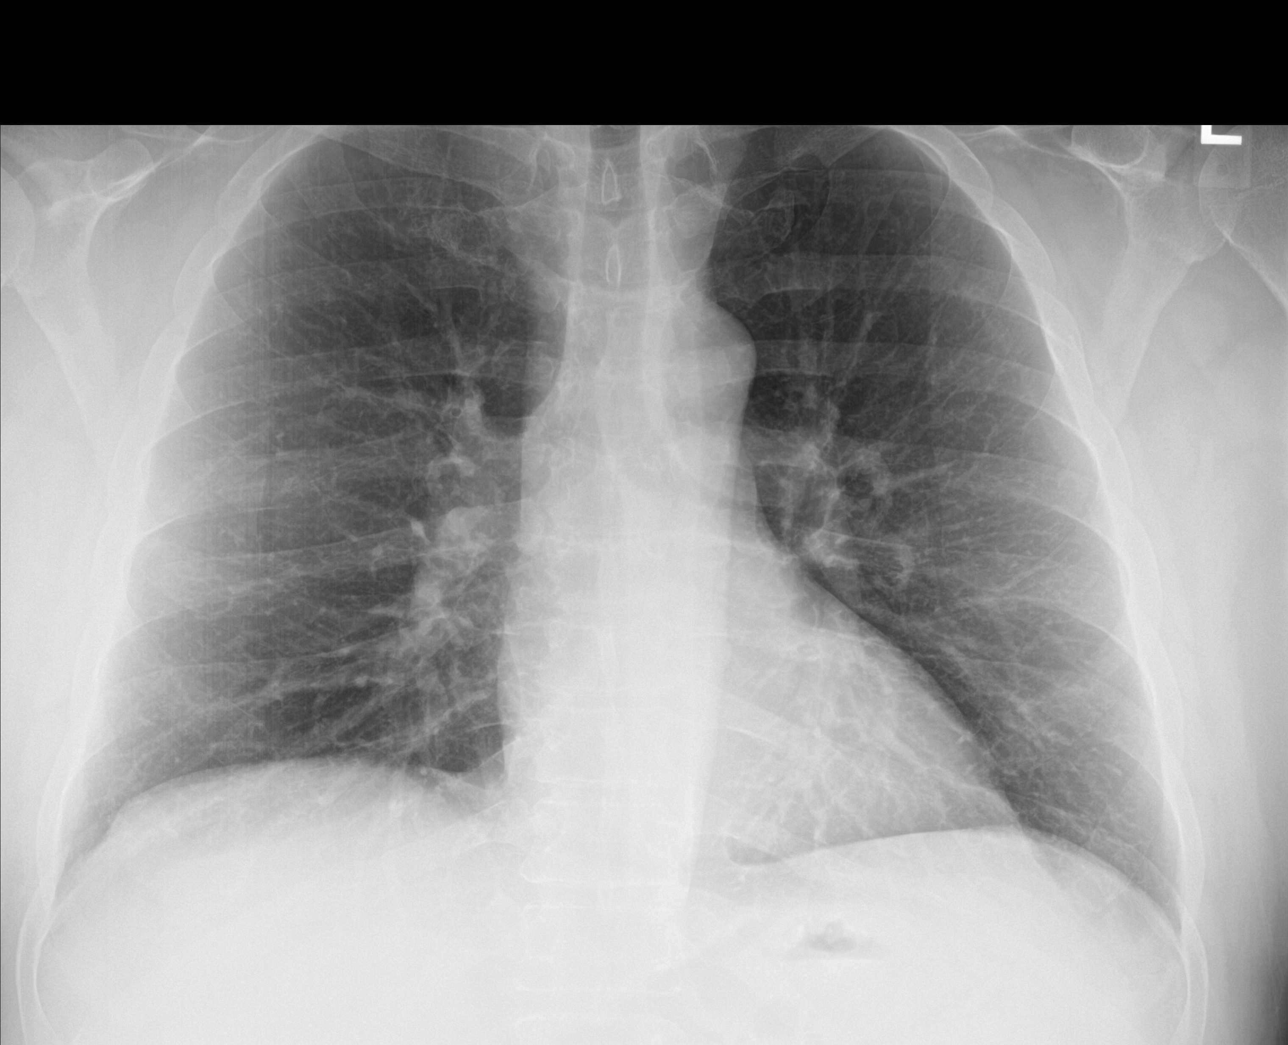

[rib obl (1 of 2)]
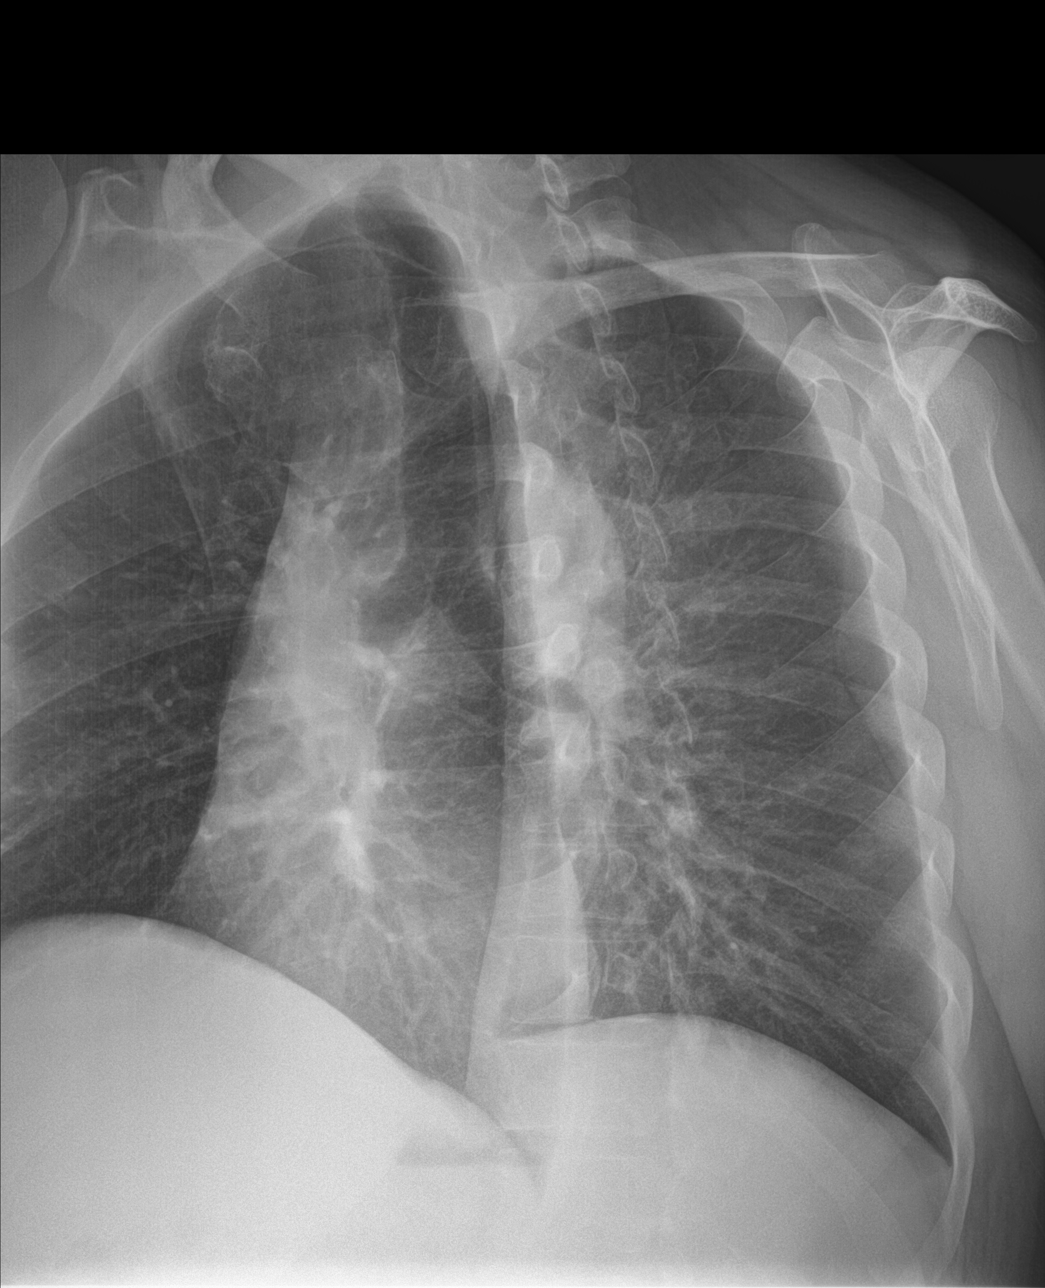

[rib obl (2 of 2)]
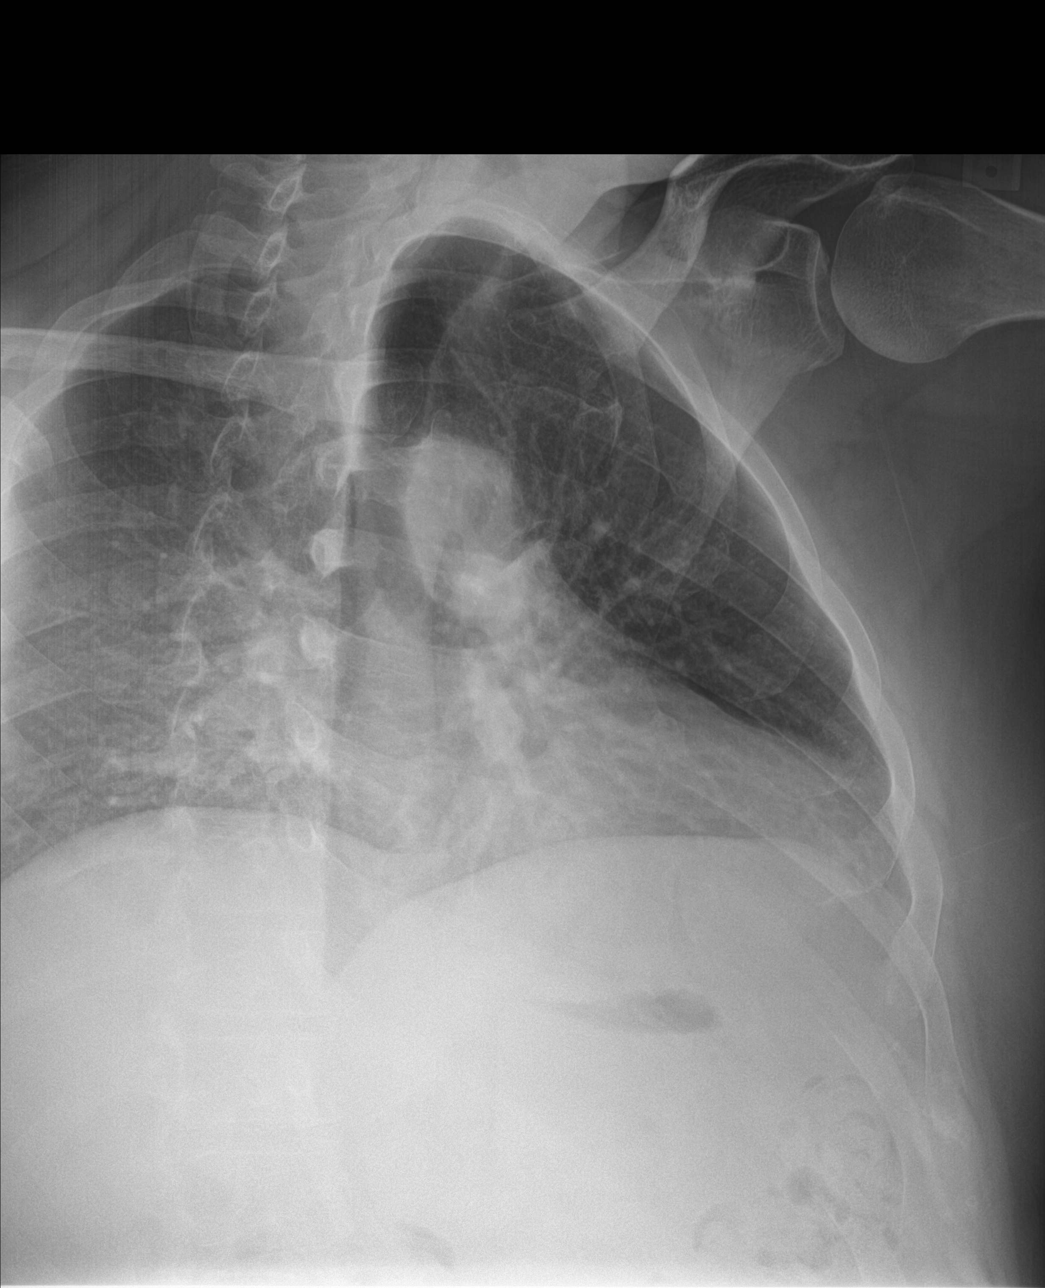

[chest ap]
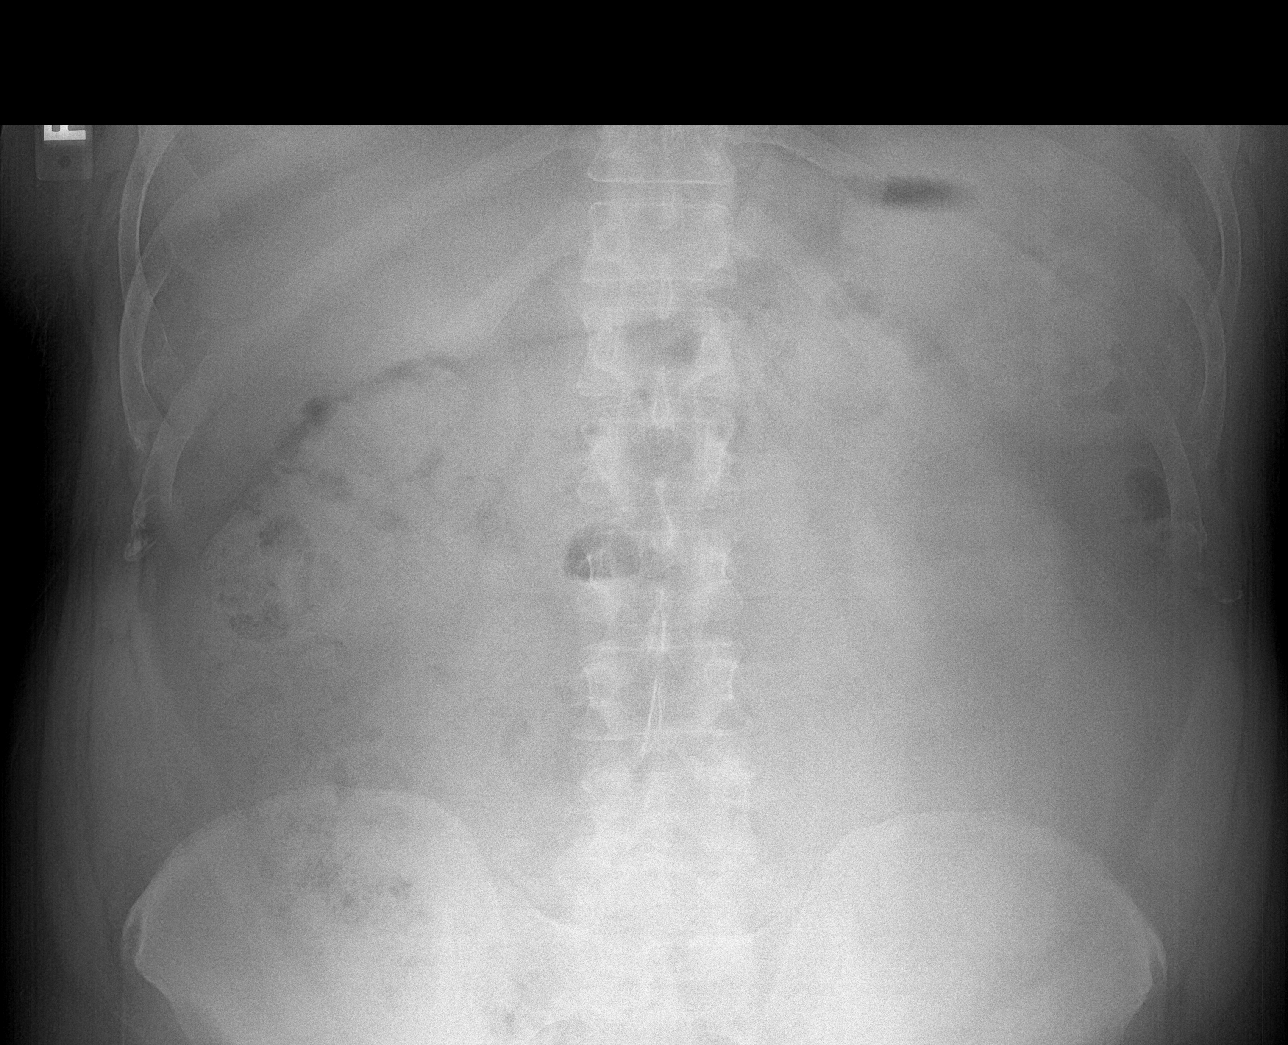

[4 of 4 positions shown; findings below may reference images not displayed]

FINDINGS: No acute rib fracture.  Old healed left fourth rib fracture.

The heart size and mediastinal contours are within normal limits.
Normal pulmonary vascularity. The lungs are clear. The visualized
bowel gas pattern is normal.
IMPRESSION: Negative.

## 2020-10-29 ENCOUNTER — Other Ambulatory Visit: Payer: Self-pay | Admitting: Family Medicine

## 2020-10-29 DIAGNOSIS — I10 Essential (primary) hypertension: Secondary | ICD-10-CM

## 2020-10-29 NOTE — Telephone Encounter (Signed)
Requested medication (s) are due for refill today: yes   Requested medication (s) are on the active medication list: yes   Last refill: 07/20/2020  Future visit scheduled: no  Notes to clinic:  overdue for follow up appointment    Requested Prescriptions  Pending Prescriptions Disp Refills   losartan (COZAAR) 50 MG tablet [Pharmacy Med Name: Losartan Potassium 50 MG Oral Tablet] 90 tablet 0    Sig: Take 1 tablet by mouth once daily      Cardiovascular:  Angiotensin Receptor Blockers Failed - 10/29/2020  8:17 AM      Failed - Cr in normal range and within 180 days    Creatinine, Ser  Date Value Ref Range Status  01/31/2020 0.93 0.76 - 1.27 mg/dL Final          Failed - K in normal range and within 180 days    Potassium  Date Value Ref Range Status  01/31/2020 4.1 3.5 - 5.2 mmol/L Final          Failed - Valid encounter within last 6 months    Recent Outpatient Visits           9 months ago Annual physical exam   [facility], Asencion Partridge, MD   1 year ago Flank pain   [facility], Asencion Partridge, MD   1 year ago Essential hypertension   [facility], Meda Coffee, MD   1 year ago Erectile dysfunction, unspecified erectile dysfunction type   [facility], Asencion Partridge, MD   1 year ago Acute streptococcal pharyngitis   [facility], Gerlene Burdock, NP              Passed - Patient is not pregnant      Passed - Last BP in normal range    BP Readings from Last 1 Encounters:  01/31/20 123/81            hydrochlorothiazide (HYDRODIURIL) 25 MG tablet [Pharmacy Med Name: hydroCHLOROthiazide 25 MG Oral Tablet] 90 tablet 0    Sig: Take 1 tablet by mouth once daily      Cardiovascular: Diuretics - Thiazide Failed - 10/29/2020  8:17 AM      Failed - Valid encounter within last 6 months    Recent Outpatient Visits           9 months ago Annual physical exam   [facility], Asencion Partridge, MD   1 year ago Flank pain   [facility], Asencion Partridge, MD   1 year ago Essential hypertension   [facility], Meda Coffee, MD   1 year ago Erectile dysfunction, unspecified erectile dysfunction type   [facility], Asencion Partridge, MD   1 year ago Acute streptococcal pharyngitis   [facility], Gerlene Burdock, NP              Passed - Ca in normal range and within 360 days    Calcium  Date Value Ref Range Status  01/31/2020 9.8 8.7 - 10.2 mg/dL Final          Passed - Cr in normal range and within 360 days    Creatinine, Ser  Date Value Ref Range Status  01/31/2020 0.93 0.76 - 1.27 mg/dL Final          Passed - K in normal range and within 360  days    Potassium  Date Value Ref Range Status  01/31/2020 4.1 3.5 - 5.2 mmol/L Final          Passed - Na in normal range and within 360 days    Sodium  Date Value Ref Range Status  01/31/2020 143 134 - 144 mmol/L Final          Passed - Last BP in normal range    BP Readings from Last 1 Encounters:  01/31/20 123/81            amLODipine (NORVASC) 5 MG tablet [Pharmacy Med Name: amLODIPine Besylate 5 MG Oral Tablet] 90 tablet 0    Sig: Take 1 tablet by mouth once daily      Cardiovascular:  Calcium Channel Blockers Failed - 10/29/2020  8:17 AM      Failed - Valid encounter within last 6 months    Recent Outpatient Visits           9 months ago Annual physical exam   [facility], Asencion Partridge, MD   1 year ago Flank pain   [facility], Asencion Partridge, MD   1 year ago Essential hypertension   [facility], Meda Coffee, MD   1 year ago Erectile dysfunction, unspecified erectile dysfunction type   [facility], Asencion Partridge, MD   1 year ago Acute streptococcal pharyngitis   [facility], Gerlene Burdock, NP              Passed - Last BP in normal range    BP Readings from Last 1  Encounters:  01/31/20 123/81

## 2021-01-25 ENCOUNTER — Other Ambulatory Visit: Payer: Self-pay | Admitting: Family Medicine

## 2021-01-25 DIAGNOSIS — I10 Essential (primary) hypertension: Secondary | ICD-10-CM

## 2021-01-25 DIAGNOSIS — F32A Depression, unspecified: Secondary | ICD-10-CM

## 2021-01-25 DIAGNOSIS — N529 Male erectile dysfunction, unspecified: Secondary | ICD-10-CM

## 2021-01-25 NOTE — Telephone Encounter (Signed)
Requested Prescriptions  Pending Prescriptions Disp Refills  . tadalafil (CIALIS) 20 MG tablet [Pharmacy Med Name: Tadalafil 20 MG Oral Tablet] 10 tablet 0    Sig: TAKE 1/2 TO 1 (ONE-HALF TO ONE) TABLET BY MOUTH EVERY OTHER DAY AS NEEDED FOR ERECTILE DYSFUNCTION     Urology: Erectile Dysfunction Agents Passed - 01/25/2021 12:20 PM      Passed - Last BP in normal range    BP Readings from Last 1 Encounters:  01/31/20 123/81         Passed - Valid encounter within last 12 months    Recent Outpatient Visits          12 months ago Annual physical exam   [facility], Asencion Partridge, MD   1 year ago Flank pain   [facility], Asencion Partridge, MD   1 year ago Essential hypertension   Primary Care at [facility], Meda Coffee, MD   1 year ago Erectile dysfunction, unspecified erectile dysfunction type   [facility], Asencion Partridge, MD   1 year ago Acute streptococcal pharyngitis   [facility], Gerlene Burdock, NP      Future Appointments            In 1 month Neva Seat Asencion Partridge, MD [facility] at [place], [employer]           . losartan (COZAAR) 50 MG tablet [Pharmacy Med Name: Losartan Potassium 50 MG Oral Tablet] 90 tablet 0    Sig: Take 1 tablet by mouth once daily     Cardiovascular:  Angiotensin Receptor Blockers Failed - 01/25/2021 12:20 PM      Failed - Cr in normal range and within 180 days    Creatinine, Ser  Date Value Ref Range Status  01/31/2020 0.93 0.76 - 1.27 mg/dL Final         Failed - K in normal range and within 180 days    Potassium  Date Value Ref Range Status  01/31/2020 4.1 3.5 - 5.2 mmol/L Final         Failed - Valid encounter within last 6 months    Recent Outpatient Visits          12 months ago Annual physical exam   [facility], Asencion Partridge, MD   1 year ago Flank pain   [facility], Asencion Partridge, MD   1 year ago Essential hypertension   Primary Care at [facility], Meda Coffee, MD   1 year ago Erectile dysfunction, unspecified erectile dysfunction type   [facility], Asencion Partridge, MD   1 year ago Acute streptococcal pharyngitis   [facility], Gerlene Burdock, NP      Future Appointments            In 1 month Shade Flood, MD Primary [facility] at [place], [facility]           Passed - Patient is not pregnant      Passed - Last BP in normal range    BP Readings from Last 1 Encounters:  01/31/20 123/81         . amLODipine (NORVASC) 5 MG tablet [Pharmacy Med Name: amLODIPine Besylate 5 MG Oral Tablet] 90 tablet 0    Sig: Take 1 tablet by mouth once daily     Cardiovascular:  Calcium Channel Blockers Failed - 01/25/2021 12:20 PM  Failed - Valid encounter within last 6 months    Recent Outpatient Visits          12 months ago Annual physical exam   [facility], Asencion Partridge, MD   1 year ago Flank pain   [facility], Asencion Partridge, MD   1 year ago Essential hypertension   Primary Care at [facility], Meda Coffee, MD   1 year ago Erectile dysfunction, unspecified erectile dysfunction type   [facility], Asencion Partridge, MD   1 year ago Acute streptococcal pharyngitis   [facility], Gerlene Burdock, NP      Future Appointments            In 1 month Shade Flood, MD Primary Care at [facility], [facility]           Passed - Last BP in normal range    BP Readings from Last 1 Encounters:  01/31/20 123/81         . hydrochlorothiazide (HYDRODIURIL) 25 MG tablet [Pharmacy Med Name: hydroCHLOROthiazide 25 MG Oral Tablet] 90 tablet 0    Sig: Take 1 tablet by mouth once daily     Cardiovascular: Diuretics - Thiazide Failed - 01/25/2021 12:20 PM      Failed - Valid encounter within last 6 months    Recent Outpatient Visits          12 months ago Annual physical exam   [facility], Asencion Partridge, MD   1 year ago Flank pain   [facility] at  [facility], Asencion Partridge, MD   1 year ago Essential hypertension   Primary Care at [facility], Meda Coffee, MD   1 year ago Erectile dysfunction, unspecified erectile dysfunction type   [facility], Asencion Partridge, MD   1 year ago Acute streptococcal pharyngitis   [facility], Gerlene Burdock, NP      Future Appointments            In 1 month Neva Seat Asencion Partridge, MD Primary Care at [place], [facility]           Passed - Ca in normal range and within 360 days    Calcium  Date Value Ref Range Status  01/31/2020 9.8 8.7 - 10.2 mg/dL Final         Passed - Cr in normal range and within 360 days    Creatinine, Ser  Date Value Ref Range Status  01/31/2020 0.93 0.76 - 1.27 mg/dL Final         Passed - K in normal range and within 360 days    Potassium  Date Value Ref Range Status  01/31/2020 4.1 3.5 - 5.2 mmol/L Final         Passed - Na in normal range and within 360 days    Sodium  Date Value Ref Range Status  01/31/2020 143 134 - 144 mmol/L Final         Passed - Last BP in normal range    BP Readings from Last 1 Encounters:  01/31/20 123/81         . buPROPion (WELLBUTRIN) 100 MG tablet [Pharmacy Med Name: buPROPion HCl 100 MG Oral Tablet] 90 tablet 0    Sig: Take 1 tablet by mouth once daily     Psychiatry: Antidepressants - bupropion Failed - 01/25/2021 12:20 PM      Failed - Completed  PHQ-2 or PHQ-9 in the last 360 days      Failed - Valid encounter within last 6 months    Recent Outpatient Visits          12 months ago Annual physical exam   [facility], Asencion Partridge, MD   1 year ago Flank pain   [facility], Asencion Partridge, MD   1 year ago Essential hypertension   Primary Care at [facility], Meda Coffee, MD   1 year ago Erectile dysfunction, unspecified erectile dysfunction type   [facility], Asencion Partridge, MD   1 year ago Acute streptococcal pharyngitis   [facility],  Gerlene Burdock, NP      Future Appointments            In 1 month Shade Flood, MD Primary Care at [facility], [facility]           Passed - Last BP in normal range    BP Readings from Last 1 Encounters:  01/31/20 123/81

## 2021-02-24 ENCOUNTER — Other Ambulatory Visit: Payer: Self-pay | Admitting: Family Medicine

## 2021-02-24 DIAGNOSIS — N529 Male erectile dysfunction, unspecified: Secondary | ICD-10-CM

## 2021-02-24 NOTE — Telephone Encounter (Signed)
Requested medications are due for refill today.  yes  Requested medications are on the active medications list.  yes  Last refill. 01/25/2021  Future visit scheduled.   yes  Notes to clinic.

## 2021-03-03 ENCOUNTER — Encounter: Payer: Self-pay | Admitting: Family Medicine

## 2021-04-30 ENCOUNTER — Ambulatory Visit (INDEPENDENT_AMBULATORY_CARE_PROVIDER_SITE_OTHER): Payer: Commercial Managed Care - PPO | Admitting: Family Medicine

## 2021-04-30 ENCOUNTER — Encounter: Payer: Self-pay | Admitting: Family Medicine

## 2021-04-30 ENCOUNTER — Other Ambulatory Visit: Payer: Self-pay

## 2021-04-30 VITALS — BP 114/86 | HR 74 | Temp 97.4°F | Wt 278.0 lb

## 2021-04-30 DIAGNOSIS — R972 Elevated prostate specific antigen [PSA]: Secondary | ICD-10-CM

## 2021-04-30 DIAGNOSIS — Z1159 Encounter for screening for other viral diseases: Secondary | ICD-10-CM | POA: Diagnosis not present

## 2021-04-30 DIAGNOSIS — I10 Essential (primary) hypertension: Secondary | ICD-10-CM

## 2021-04-30 DIAGNOSIS — Z0000 Encounter for general adult medical examination without abnormal findings: Secondary | ICD-10-CM

## 2021-04-30 DIAGNOSIS — N529 Male erectile dysfunction, unspecified: Secondary | ICD-10-CM

## 2021-04-30 DIAGNOSIS — B353 Tinea pedis: Secondary | ICD-10-CM

## 2021-04-30 DIAGNOSIS — Z23 Encounter for immunization: Secondary | ICD-10-CM

## 2021-04-30 DIAGNOSIS — L309 Dermatitis, unspecified: Secondary | ICD-10-CM

## 2021-04-30 DIAGNOSIS — F32A Depression, unspecified: Secondary | ICD-10-CM

## 2021-04-30 DIAGNOSIS — Z1211 Encounter for screening for malignant neoplasm of colon: Secondary | ICD-10-CM

## 2021-04-30 MED ORDER — BUPROPION HCL 100 MG PO TABS: 100.0000 mg | ORAL_TABLET | Freq: Every day | ORAL | 2 refills | Status: DC

## 2021-04-30 MED ORDER — TADALAFIL 20 MG PO TABS: ORAL_TABLET | ORAL | 5 refills | Status: DC

## 2021-04-30 MED ORDER — LOSARTAN POTASSIUM 50 MG PO TABS: 1.0000 | ORAL_TABLET | Freq: Every day | ORAL | 2 refills | Status: DC

## 2021-04-30 MED ORDER — AMLODIPINE BESYLATE 5 MG PO TABS: 1.0000 | ORAL_TABLET | Freq: Every day | ORAL | 2 refills | Status: DC

## 2021-04-30 MED ORDER — HYDROCHLOROTHIAZIDE 25 MG PO TABS: 1.0000 | ORAL_TABLET | Freq: Every day | ORAL | 2 refills | Status: DC

## 2021-04-30 MED ORDER — CLOTRIMAZOLE-BETAMETHASONE 1-0.05 % EX CREA: 1.0000 "application " | TOPICAL_CREAM | Freq: Two times a day (BID) | CUTANEOUS | 0 refills | Status: DC

## 2021-04-30 NOTE — Progress Notes (Signed)
Subjective:  Patient ID: Cameron Chase, male    DOB: 04-26-1971  Age: 50 y.o. MRN: [id number]  CC:  Chief Complaint  Patient presents with  . Annual Exam    Fasting, wanting to discuss weight loss options     HPI Cameron Chase presents for   Annual physical exam:   Hypertension: Amlodipine 5 mg daily, losartan 50 mg daily, HCTZ 25 mg daily. Home readings: none.  No new side effects. BP similar last week at blood donation.  BP Readings from Last 3 Encounters:  04/30/21 114/86  01/31/20 123/81  08/16/19 121/85   Lab Results  Component Value Date   CREATININE 0.93 01/31/2020   Hyperlipidemia: No current statin.  Diet/exercise approach. No statin meds. Fasting today.  Lab Results  Component Value Date   CHOL 200 (H) 01/31/2020   HDL 53 01/31/2020   LDLCALC 125 (H) 01/31/2020   TRIG 124 01/31/2020   CHOLHDL 3.8 01/31/2020   Lab Results  Component Value Date   ALT 13 01/31/2020   AST 21 01/31/2020   ALKPHOS 69 01/31/2020   BILITOT 0.5 01/31/2020   Depression: Depression screen [facility] 2/9 04/30/2021 01/31/2020 08/16/2019 07/27/2019 06/07/2019  Decreased Interest 0 0 0 0 0  Down, Depressed, Hopeless 0 0 0 0 0  PHQ - 2 Score 0 0 0 0 0  Treated with Wellbutrin 100 mg daily, managing symptoms well at current dose without new reported side effects. Not feeling depressed. Dating same woman past 3 years - Going well. Single dad. Son graduated HS, daughter rising sophomore at [employer].   Obesity: Body mass index is 34.75 kg/m. Wt Readings from Last 3 Encounters:  04/30/21 278 lb (126.1 kg)  01/31/20 277 lb (125.6 kg)  08/16/19 274 lb (124.3 kg)  Discussed increased exercise and diet changes at his physical last year, at that time he had been increasing exercise to 2 to 3 days/week but had identified some possible changes for diet. Some spurts of improved exercise and diet, off lately. Had increased, then down 10 pounds.  Has had some difficulty losing weight, would like some  options/resources for weight loss.  Erectile dysfunction Treated with Cialis 5-10 mg as needed.  Denies any new vision/hearing changes, headache or flushing, or chest pain/dyspnea with exertion.  Aware of possible side effects and risk of medication.  Cancer screening No family history of colon cancer.  Timing of recommended onset of colonoscopy has changed since his last physical. Screening options with colonoscopy versus Cologuard discussed. Discussed timing of repeat testing intervals if normal, as well as potential need for diagnostic Colonoscopy if positive Cologuard. Understanding expressed, and chose Colonoscopy.  Prostate cancer testing: Slightly elevated PSA last year, recommended urology follow-up for DRE and discussion of results.  Has not seen urology.  Denies new urinary symptoms including hematuria, signs or symptoms of retention, dysuria or change in stream.  Denies nocturia. The natural history of prostate cancer and ongoing controversy regarding screening and potential treatment outcomes of prostate cancer has been discussed with the patient. The meaning of a false positive PSA and a false negative PSA has been discussed. He indicates understanding of the limitations of this screening test and wishes to proceed with screening PSA testing.  Lab Results  Component Value Date   PSA1 4.1 (H) 01/31/2020   Immunization History  Administered Date(s) Administered  . Influenza,inj,Quad PF,6+ Mos 10/08/2015, 11/14/2018, 08/16/2019  . Moderna SARS-COV2 Booster Vaccination 09/25/2020  . Moderna Sars-Covid-2 Vaccination 02/02/2020, 03/03/2020  .  Tdap 11/14/2018  . Zoster Recombinat (Shingrix) 04/30/2021   No exam data present optho - 1 year ago - glasses for computer.   Dental: Every 42month.   Exercise:  1day at gym, walking dogs on all days.      History Patient Active Problem List   Diagnosis Date Noted  . Hyperlipidemia 02/07/2016  . Erectile dysfunction 07/29/2012  .  Benign hypertension 07/18/2012  . Depression 07/18/2012   Past Medical History:  Diagnosis Date  . Depression   . Hypertension    History reviewed. No pertinent surgical history. No Known Allergies Prior to Admission medications   Medication Sig Start Date End Date Taking? Authorizing Provider  amLODipine (NORVASC) 5 MG tablet Take 1 tablet by mouth once daily 01/25/21  Yes GWendie Agreste MD  buPROPion ([facility] 100 MG tablet Take 1 tablet by mouth once daily 01/25/21  Yes GWendie Agreste MD  clotrimazole (LOTRIMIN) 1 % cream Apply 1 application topically 2 (two) times daily. 01/31/20  Yes GWendie Agreste MD  hydrochlorothiazide (HYDRODIURIL) 25 MG tablet Take 1 tablet by mouth once daily 01/25/21  Yes GWendie Agreste MD  losartan (COZAAR) 50 MG tablet Take 1 tablet by mouth once daily 01/25/21  Yes GWendie Agreste MD  meloxicam (MOBIC) 7.5 MG tablet Take 1 tablet (7.5 mg total) by mouth daily. 08/16/19  Yes GWendie Agreste MD  olopatadine (PATANOL) 0.1 % ophthalmic solution Place 1 drop into both eyes 2 (two) times daily. 10/08/15  Yes HPosey Boyer MD  tadalafil (CIALIS) 20 MG tablet TAKE 1/2 TO 1 TABLET BY MOUTH EVERY OTHER DAY AS NEEDED FOR ERECTILE DYSRUNCTION 03/25/21  Yes GWendie Agreste MD   Social History   Socioeconomic History  . Marital status: Divorced    Spouse name: Not on file  . Number of children: Not on file  . Years of education: Not on file  . Highest education level: Not on file  Occupational History  . Not on file  Tobacco Use  . Smoking status: Never Smoker  . Smokeless tobacco: Never Used  Substance and Sexual Activity  . Alcohol use: Yes  . Drug use: No  . Sexual activity: Not on file  Other Topics Concern  . Not on file  Social History Narrative  . Not on file   Social Determinants of Health   Financial Resource Strain: Not on file  Food Insecurity: Not on file  Transportation Needs: Not on file  Physical Activity: Not on  file  Stress: Not on file  Social Connections: Not on file  Intimate Partner Violence: Not on file    Review of Systems   Objective:   Vitals:   04/30/21 1409  BP: 114/86  Pulse: 74  Temp: (!) 97.4 F (36.3 C)  TempSrc: Temporal  SpO2: 98%  Weight: 278 lb (126.1 kg)     Physical Exam Vitals reviewed.  Constitutional:      Appearance: He is well-developed.  HENT:     Head: Normocephalic and atraumatic.     Right Ear: External ear normal.     Left Ear: External ear normal.  Eyes:     Conjunctiva/sclera: Conjunctivae normal.     Pupils: Pupils are equal, round, and reactive to light.  Neck:     Thyroid: No thyromegaly.  Cardiovascular:     Rate and Rhythm: Normal rate and regular rhythm.     Heart sounds: Normal heart sounds.  Pulmonary:     Effort: Pulmonary  effort is normal. No respiratory distress.     Breath sounds: Normal breath sounds. No wheezing.  Abdominal:     General: There is no distension.     Palpations: Abdomen is soft.     Tenderness: There is no abdominal tenderness.  Musculoskeletal:        General: No tenderness. Normal range of motion.     Cervical back: Normal range of motion and neck supple.  Lymphadenopathy:     Cervical: No cervical adenopathy.  Skin:    General: Skin is warm and dry.     Comments: Foot exam with dry/cracked skin at the posterior heels, with small rounded dry vesicular appearing lesions plantar foot distally.   Neurological:     Mental Status: He is alert and oriented to person, place, and time.     Deep Tendon Reflexes: Reflexes are normal and symmetric.  Psychiatric:        Behavior: Behavior normal.    R foot:     Assessment & Plan:  Cameron Chase is a 50 y.o. male . Annual physical exam - Plan: TSH, HgB A1c, CBC with Differential/Platelet, Lipid panel, Hepatitis C Antibody, Comp Met (CMET)  - -anticipatory guidance as below in AVS, screening labs above. Health maintenance items as above in HPI  discussed/recommended as applicable.   - number provided to weight loss mgt.   Essential hypertension - Plan: CBC with Differential/Platelet, Comp Met (CMET), amLODipine (NORVASC) 5 MG tablet, hydrochlorothiazide (HYDRODIURIL) 25 MG tablet, losartan (COZAAR) 50 MG tablet  -  Stable, tolerating current regimen. Medications refilled. Labs pending as above.   Encounter for hepatitis C screening test for low risk patient - Plan: Hepatitis C Antibody  Need for shingles vaccine - Plan: Varicella-zoster vaccine IM - 1st dose given. Possible side effects discussed.   Elevated PSA - Plan: PSA  - repeat PSA, then urology eval if still elevated.   Screen for colon cancer - Plan: Ambulatory referral to Gastroenterology  Tinea pedis, unspecified laterality - Plan: clotrimazole-betamethasone (LOTRISONE) cream Chronic eczema of foot - Plan: clotrimazole-betamethasone (LOTRISONE) cream  - possible combo of tinea pedis and chronic eczema. Trial of lotrisone, recheck in 3-4 weeks.   Depression, unspecified depression type - Plan: buPROPion (WELLBUTRIN) 100 MG tablet  -  Stable, tolerating current regimen. Medications refilled.   Erectile dysfunction, unspecified erectile dysfunction type - Plan: tadalafil (CIALIS) 20 MG tablet  - cialis Rx given - use lowest effective dose. Side effects discussed (including but not limited to headache/flushing, blue discoloration of vision, possible vascular steal and risk of cardiac effects if underlying unknown coronary artery disease, and permanent sensorineural hearing loss). Understanding expressed.   Meds ordered this encounter  Medications  . clotrimazole-betamethasone (LOTRISONE) cream    Sig: Apply 1 application topically 2 (two) times daily.    Dispense:  45 g    Refill:  0  . amLODipine (NORVASC) 5 MG tablet    Sig: Take 1 tablet (5 mg total) by mouth daily.    Dispense:  90 tablet    Refill:  2  . buPROPion (WELLBUTRIN) 100 MG tablet    Sig: Take 1  tablet (100 mg total) by mouth daily.    Dispense:  90 tablet    Refill:  2  . hydrochlorothiazide (HYDRODIURIL) 25 MG tablet    Sig: Take 1 tablet (25 mg total) by mouth daily.    Dispense:  90 tablet    Refill:  2  . losartan (COZAAR) 50 MG  tablet    Sig: Take 1 tablet (50 mg total) by mouth daily.    Dispense:  90 tablet    Refill:  2  . tadalafil (CIALIS) 20 MG tablet    Sig: TAKE 1/2 TO 1 TABLET BY MOUTH EVERY OTHER DAY AS NEEDED FOR ERECTILE DYSRUNCTION    Dispense:  10 tablet    Refill:  5   Patient Instructions  [facility]  . [phone] I will refer you for colonoscopy.  1st dose of shingrix vaccine today, repeat in 2-6 months.  Foot rash is likely combination of eczema and tinea pedis or athlete's foot.  I prescribed a combination steroid and antifungal medication that can be applied once or twice per day.  Recheck in the next 3 to 4 weeks, sooner if any worsening symptoms.  Keeping you healthy  Get these tests  Blood pressure- Have your blood pressure checked once a year by your healthcare provider.  Normal blood pressure is 120/80  Weight- Have your body mass index (BMI) calculated to screen for obesity.  BMI is a measure of body fat based on height and weight. You can also calculate your own BMI at [email or web address].  Cholesterol- Have your cholesterol checked every year.  Diabetes- Have your blood sugar checked regularly if you have high blood pressure, high cholesterol, have a family history of diabetes or if you are overweight.  Screening for Colon Cancer- Colonoscopy starting at age 56.  Screening may begin sooner depending on your family history and other health conditions. Follow up colonoscopy as directed by your Gastroenterologist.  Screening for Prostate Cancer- Both blood work (PSA) and a rectal exam help screen for Prostate Cancer.  Screening begins at age 66 with African-American men and at age 39  with Caucasian men.  Screening may begin sooner depending on your family history.   Take these medicines  Flu shot- Every fall.  Tetanus- Every 10 years.  Pneumonia shot- Once after the age of 1; if you are younger than 67, ask your healthcare provider if you need a Pneumonia shot.  Take these steps  Don't smoke- If you do smoke, talk to your doctor about quitting.  For tips on how to quit, go to www.smokefree.gov or call 1-800-QUIT-NOW.  Be physically active- Exercise 5 days a week for at least 30 minutes.  If you are not already physically active start slow and gradually work up to 30 minutes of moderate physical activity.  Examples of moderate activity include walking briskly, mowing the yard, dancing, swimming, bicycling, etc.  Eat a healthy diet- Eat a variety of healthy food such as fruits, vegetables, low fat milk, low fat cheese, yogurt, lean meant, poultry, fish, beans, tofu, etc. For more information go to www.thenutritionsource.org  Drink alcohol in moderation- Limit alcohol intake to less than two drinks a day. Never drink and drive.  Dentist- Brush and floss twice daily; visit your dentist twice a year.  Depression- Your emotional health is as important as your physical health. If you're feeling down, or losing interest in things you would normally enjoy please talk to your healthcare provider.  Eye exam- Visit your eye doctor every year.  Safe sex- If you may be exposed to a sexually transmitted infection, use a condom.  Seat belts- Seat belts can save your life; always wear one.  Smoke/Carbon Monoxide detectors- These detectors need to be installed on the appropriate level of your home.  Replace batteries at least once a  year.  Skin cancer- When out in the sun, cover up and use sunscreen 15 SPF or higher.  Violence- If anyone is threatening you, please tell your healthcare provider.  Living Will/ Health care power of attorney- Speak with your healthcare provider  and family.      Signed, Merri Ray, MD [facility]

## 2021-04-30 NOTE — Patient Instructions (Addendum)
[facility]  . [phone] I will refer you for colonoscopy.  1st dose of shingrix vaccine today, repeat in 2-6 months.  Foot rash is likely combination of eczema and tinea pedis or athlete's foot.  I prescribed a combination steroid and antifungal medication that can be applied once or twice per day.  Recheck in the next 3 to 4 weeks, sooner if any worsening symptoms.  Keeping you healthy  Get these tests  Blood pressure- Have your blood pressure checked once a year by your healthcare provider.  Normal blood pressure is 120/80  Weight- Have your body mass index (BMI) calculated to screen for obesity.  BMI is a measure of body fat based on height and weight. You can also calculate your own BMI at [email or web address].  Cholesterol- Have your cholesterol checked every year.  Diabetes- Have your blood sugar checked regularly if you have high blood pressure, high cholesterol, have a family history of diabetes or if you are overweight.  Screening for Colon Cancer- Colonoscopy starting at age 45.  Screening may begin sooner depending on your family history and other health conditions. Follow up colonoscopy as directed by your Gastroenterologist.  Screening for Prostate Cancer- Both blood work (PSA) and a rectal exam help screen for Prostate Cancer.  Screening begins at age 51 with African-American men and at age 10 with Caucasian men.  Screening may begin sooner depending on your family history.   Take these medicines  Flu shot- Every fall.  Tetanus- Every 10 years.  Pneumonia shot- Once after the age of 73; if you are younger than 83, ask your healthcare provider if you need a Pneumonia shot.  Take these steps  Don't smoke- If you do smoke, talk to your doctor about quitting.  For tips on how to quit, go to www.smokefree.gov or call 1-800-QUIT-NOW.  Be physically active- Exercise 5 days a week for at least 30 minutes.  If you are  not already physically active start slow and gradually work up to 30 minutes of moderate physical activity.  Examples of moderate activity include walking briskly, mowing the yard, dancing, swimming, bicycling, etc.  Eat a healthy diet- Eat a variety of healthy food such as fruits, vegetables, low fat milk, low fat cheese, yogurt, lean meant, poultry, fish, beans, tofu, etc. For more information go to www.thenutritionsource.org  Drink alcohol in moderation- Limit alcohol intake to less than two drinks a day. Never drink and drive.  Dentist- Brush and floss twice daily; visit your dentist twice a year.  Depression- Your emotional health is as important as your physical health. If you're feeling down, or losing interest in things you would normally enjoy please talk to your healthcare provider.  Eye exam- Visit your eye doctor every year.  Safe sex- If you may be exposed to a sexually transmitted infection, use a condom.  Seat belts- Seat belts can save your life; always wear one.  Smoke/Carbon Monoxide detectors- These detectors need to be installed on the appropriate level of your home.  Replace batteries at least once a year.  Skin cancer- When out in the sun, cover up and use sunscreen 15 SPF or higher.  Violence- If anyone is threatening you, please tell your healthcare provider.  Living Will/ Health care power of attorney- Speak with your healthcare provider and family.

## 2021-05-01 LAB — CBC WITH DIFFERENTIAL/PLATELET
Basophils Absolute: 0.1 10*3/uL (ref 0.0–0.1)
Basophils Relative: 0.8 % (ref 0.0–3.0)
Eosinophils Absolute: 0.2 10*3/uL (ref 0.0–0.7)
Eosinophils Relative: 2.6 % (ref 0.0–5.0)
HCT: 41.4 % (ref 39.0–52.0)
Hemoglobin: 14.7 g/dL (ref 13.0–17.0)
Lymphocytes Relative: 15.4 % (ref 12.0–46.0)
Lymphs Abs: 1 10*3/uL (ref 0.7–4.0)
MCHC: 35.5 g/dL (ref 30.0–36.0)
MCV: 86.3 fl (ref 78.0–100.0)
Monocytes Absolute: 0.5 10*3/uL (ref 0.1–1.0)
Monocytes Relative: 8.2 % (ref 3.0–12.0)
Neutro Abs: 4.7 10*3/uL (ref 1.4–7.7)
Neutrophils Relative %: 73 % (ref 43.0–77.0)
Platelets: 308 10*3/uL (ref 150.0–400.0)
RBC: 4.8 Mil/uL (ref 4.22–5.81)
RDW: 12.9 % (ref 11.5–15.5)
WBC: 6.4 10*3/uL (ref 4.0–10.5)

## 2021-05-01 LAB — TSH: TSH: 1.77 u[IU]/mL (ref 0.35–4.50)

## 2021-05-01 LAB — HEPATITIS C ANTIBODY
Hepatitis C Ab: NONREACTIVE
SIGNAL TO CUT-OFF: 0.03 (ref <1.00)

## 2021-05-01 LAB — PSA: PSA: 3.88 ng/mL (ref 0.10–4.00)

## 2021-05-01 LAB — HEMOGLOBIN A1C: Hgb A1c MFr Bld: 5 % (ref 4.6–6.5)

## 2021-05-02 LAB — COMPREHENSIVE METABOLIC PANEL
ALT: 15 U/L (ref 0–53)
AST: 19 U/L (ref 0–37)
Albumin: 4.9 g/dL (ref 3.5–5.2)
Alkaline Phosphatase: 70 U/L (ref 39–117)
BUN: 13 mg/dL (ref 6–23)
CO2: 22 mEq/L (ref 19–32)
Calcium: 9.9 mg/dL (ref 8.4–10.5)
Chloride: 102 mEq/L (ref 96–112)
Creatinine, Ser: 0.88 mg/dL (ref 0.40–1.50)
GFR: 100.59 mL/min (ref >60.00)
Glucose, Bld: 83 mg/dL (ref 70–99)
Potassium: 3.8 mEq/L (ref 3.5–5.1)
Sodium: 142 mEq/L (ref 135–145)
Total Bilirubin: 0.8 mg/dL (ref 0.2–1.2)
Total Protein: 7.2 g/dL (ref 6.0–8.3)

## 2021-05-02 LAB — LIPID PANEL
Cholesterol: 206 mg/dL — ABNORMAL HIGH (ref 0–200)
HDL: 55 mg/dL (ref >39.00)
LDL Cholesterol: 118 mg/dL — ABNORMAL HIGH (ref 0–99)
NonHDL: 150.74
Total CHOL/HDL Ratio: 4
Triglycerides: 164 mg/dL — ABNORMAL HIGH (ref 0.0–149.0)
VLDL: 32.8 mg/dL (ref 0.0–40.0)

## 2021-05-12 ENCOUNTER — Telehealth (INDEPENDENT_AMBULATORY_CARE_PROVIDER_SITE_OTHER): Payer: Commercial Managed Care - PPO | Admitting: Family Medicine

## 2021-05-12 ENCOUNTER — Encounter: Payer: Self-pay | Admitting: Family Medicine

## 2021-05-12 DIAGNOSIS — R059 Cough, unspecified: Secondary | ICD-10-CM | POA: Diagnosis not present

## 2021-05-12 DIAGNOSIS — R509 Fever, unspecified: Secondary | ICD-10-CM | POA: Diagnosis not present

## 2021-05-12 DIAGNOSIS — R0600 Dyspnea, unspecified: Secondary | ICD-10-CM | POA: Diagnosis not present

## 2021-05-12 DIAGNOSIS — J22 Unspecified acute lower respiratory infection: Secondary | ICD-10-CM

## 2021-05-12 MED ORDER — AZITHROMYCIN 250 MG PO TABS: ORAL_TABLET | ORAL | 0 refills | Status: AC

## 2021-05-12 MED ORDER — HYDROCODONE BIT-HOMATROP MBR 5-1.5 MG/5ML PO SOLN: 5.0000 mL | Freq: Every evening | ORAL | 0 refills | Status: DC | PRN

## 2021-05-12 NOTE — Progress Notes (Addendum)
Virtual Visit via Video Note  I connected with Cameron Chase on 05/12/21 at 1:14 PM by a video enabled telemedicine application and verified that I am speaking with the correct person using two identifiers.  Patient location: home My location: office -[facility]    I discussed the limitations, risks, security and privacy concerns of performing an evaluation and management service by telephone and the availability of in person appointments. I also discussed with the patient that there may be a patient responsible charge related to this service. The patient expressed understanding and agreed to proceed, consent obtained  Chief complaint:  Chief Complaint  Patient presents with   Cough    Pt reports congestion, fatigue starting Wed last week, feels SOB with cough and continued congestion, pt had fever and chills over weekend but no longer.      History of Present Illness: Cameron Chase is a 50 y.o. male  Cough Initial sore throat, some nasal congestion, fatigue. Started 5 days ago. Rapid test negative for covid daily. Most recent yesterday negative. PCR test today - results pending. Feels more in chest, some shortness of breath past few days - primarily with activity. Home O2 sat 97 currently - lowest 95. . Subjective f/c this weekend.   No sick contacts.  No rash.   Tx: sudafed, mucinex. OTC nose spray.   No wheeze, no hx of asthma. Staying hydrated.   COVID-19 vaccine March and April 2021 with booster October 2021,  risk of complications score of 3.  Patient Active Problem List   Diagnosis Date Noted   Hyperlipidemia 02/07/2016   Erectile dysfunction 07/29/2012   Benign hypertension 07/18/2012   Depression 07/18/2012   Past Medical History:  Diagnosis Date   Depression    Hypertension    No past surgical history on file. No Known Allergies Prior to Admission medications   Medication Sig Start Date End Date Taking? Authorizing Provider  amLODipine (NORVASC) 5 MG  tablet Take 1 tablet (5 mg total) by mouth daily. 04/30/21   Shade Flood, MD  buPROPion (WELLBUTRIN) 100 MG tablet Take 1 tablet (100 mg total) by mouth daily. 04/30/21   Shade Flood, MD  clotrimazole (LOTRIMIN) 1 % cream Apply 1 application topically 2 (two) times daily. 01/31/20   Shade Flood, MD  clotrimazole-betamethasone (LOTRISONE) cream Apply 1 application topically 2 (two) times daily. 04/30/21   Shade Flood, MD  hydrochlorothiazide (HYDRODIURIL) 25 MG tablet Take 1 tablet (25 mg total) by mouth daily. 04/30/21   Shade Flood, MD  losartan (COZAAR) 50 MG tablet Take 1 tablet (50 mg total) by mouth daily. 04/30/21   Shade Flood, MD  meloxicam (MOBIC) 7.5 MG tablet Take 1 tablet (7.5 mg total) by mouth daily. 08/16/19   Shade Flood, MD  olopatadine (PATANOL) 0.1 % ophthalmic solution Place 1 drop into both eyes 2 (two) times daily. 10/08/15   Peyton Najjar, MD  tadalafil (CIALIS) 20 MG tablet TAKE 1/2 TO 1 TABLET BY MOUTH EVERY OTHER DAY AS NEEDED FOR ERECTILE DYSRUNCTION 04/30/21   Shade Flood, MD   Social History   Socioeconomic History   Marital status: Divorced    Spouse name: Not on file   Number of children: Not on file   Years of education: Not on file   Highest education level: Not on file  Occupational History   Not on file  Tobacco Use   Smoking status: Never   Smokeless tobacco: Never  Substance and Sexual Activity   Alcohol use: Yes   Drug use: No   Sexual activity: Not on file  Other Topics Concern   Not on file  Social History Narrative   Not on file   Social Determinants of Health   Financial Resource Strain: Not on file  Food Insecurity: Not on file  Transportation Needs: Not on file  Physical Activity: Not on file  Stress: Not on file  Social Connections: Not on file  Intimate Partner Violence: Not on file    Observations/Objective: There were no vitals filed for this visit. O2sat 97% RA. No other vitals.  Speaking  in full sentences, no apparent respiratory distress on video, nontoxic appearance on video, euthymic mood.  Appropriate responses.  All questions were answered with understanding plan expressed.  Assessment and Plan: Cough - Plan: azithromycin (ZITHROMAX) 250 MG tablet, HYDROcodone bit-homatropine (HYCODAN) 5-1.5 MG/5ML syrup  LRTI (lower respiratory tract infection) - Plan: azithromycin (ZITHROMAX) 250 MG tablet  Fever and chills - Plan: azithromycin (ZITHROMAX) 250 MG tablet  Dyspnea, unspecified type  Initial likely viral illness, negative COVID testing multiple times including most recently yesterday, less likely COVID-19 infection but does have PCR test pending.  Secondary worsening past 2 days with fever/chills, worsening chest symptoms and dyspnea.  Differential includes early community-acquired pneumonia.  Start azithromycin, continue Mucinex during the day, hydrocodone cough syrup if needed at night with potential side effects discussed, and not to take if dyspneic.  ER precautions given if increasing dyspnea or worsening symptoms.   Follow Up Instructions: Urgent care/ER precautions.   I discussed the assessment and treatment plan with the patient. The patient was provided an opportunity to ask questions and all were answered. The patient agreed with the plan and demonstrated an understanding of the instructions.   The patient was advised to call back or seek an in-person evaluation if the symptoms worsen or if the condition fails to improve as anticipated.  I provided 17 minutes of non-face-to-face time during this encounter.   Shade Flood, MD

## 2021-05-15 ENCOUNTER — Encounter: Payer: Self-pay | Admitting: Family Medicine

## 2021-05-15 ENCOUNTER — Ambulatory Visit: Payer: Commercial Managed Care - PPO | Admitting: Family Medicine

## 2021-05-28 ENCOUNTER — Ambulatory Visit: Payer: Commercial Managed Care - PPO | Admitting: Family Medicine

## 2021-05-29 ENCOUNTER — Other Ambulatory Visit: Payer: Self-pay | Admitting: Family Medicine

## 2021-05-29 DIAGNOSIS — B353 Tinea pedis: Secondary | ICD-10-CM

## 2021-05-29 DIAGNOSIS — L309 Dermatitis, unspecified: Secondary | ICD-10-CM

## 2021-05-30 NOTE — Telephone Encounter (Signed)
LFD 01/30/21 #30g with no refills LOV 05/12/21 NOV 07/10/21

## 2021-07-10 ENCOUNTER — Ambulatory Visit: Payer: Commercial Managed Care - PPO | Admitting: Family Medicine

## 2021-08-21 ENCOUNTER — Encounter: Payer: Self-pay | Admitting: Gastroenterology

## 2021-09-08 ENCOUNTER — Ambulatory Visit: Payer: Commercial Managed Care - PPO | Admitting: Family Medicine

## 2021-10-13 ENCOUNTER — Ambulatory Visit: Payer: Commercial Managed Care - PPO | Admitting: Family Medicine

## 2021-10-16 ENCOUNTER — Encounter: Payer: Commercial Managed Care - PPO | Admitting: Gastroenterology

## 2021-11-10 ENCOUNTER — Encounter: Payer: Self-pay | Admitting: Family Medicine

## 2021-11-11 ENCOUNTER — Other Ambulatory Visit: Payer: Self-pay

## 2021-11-11 ENCOUNTER — Telehealth (INDEPENDENT_AMBULATORY_CARE_PROVIDER_SITE_OTHER): Payer: Commercial Managed Care - PPO | Admitting: Family Medicine

## 2021-11-11 ENCOUNTER — Encounter: Payer: Self-pay | Admitting: Family Medicine

## 2021-11-11 DIAGNOSIS — U071 COVID-19: Secondary | ICD-10-CM | POA: Diagnosis not present

## 2021-11-11 MED ORDER — MOLNUPIRAVIR EUA 200MG CAPSULE: 4.0000 | ORAL_CAPSULE | Freq: Two times a day (BID) | ORAL | 0 refills | Status: AC

## 2021-11-11 NOTE — Progress Notes (Signed)
Virtual Visit via Video Note  I connected with Cameron Chase on 11/11/21 at 12:00 PM EST by a video enabled telemedicine application and verified that I am speaking with the correct person using two identifiers.  Location: Patient: home Provider: office   I discussed the limitations of evaluation and management by telemedicine and the availability of in person appointments. The patient expressed understanding and agreed to proceed.  Parties involved in encounter  Patient: Cameron Chase  Provider:  Roxy Manns MD    HPI:  Pt presents for c/o covid 19  Wt Readings from Last 3 Encounters:  11/11/21 272 lb (123.4 kg)  04/30/21 278 lb (126.1 kg)  01/31/20 277 lb (125.6 kg)   Temp 99 Started sympt on Sunday  Fever/cough/congestion /ST Pos covid test 12/12 Isolating   Cough-prod green sputum Nasal congestion  ST is better  No wheeze or sob  No n/v/d No ear pain  Some chills -advil helped  Headache yesterday  No sinus pain   Otc: Advil Tylenol  Mucinex Sinex? Ns  Has saline to use   Is immunized for covid but not bivalent vaccine yet  Patient Active Problem List   Diagnosis Date Noted   COVID-19 11/11/2021   Hyperlipidemia 02/07/2016   Erectile dysfunction 07/29/2012   Benign hypertension 07/18/2012   Depression 07/18/2012   Past Medical History:  Diagnosis Date   Depression    Hypertension    History reviewed. No pertinent surgical history. Social History   Tobacco Use   Smoking status: Never   Smokeless tobacco: Never  Substance Use Topics   Alcohol use: Yes   Drug use: No   Family History  Problem Relation Age of Onset   Hyperlipidemia Mother    Cancer Mother    Depression Mother    Hyperlipidemia Father    Hypertension Father    Diabetes Father    Diabetes Brother    Stroke Maternal Grandmother    Mental retardation Maternal Grandfather    No Known Allergies Current Outpatient Medications on File Prior to Visit  Medication Sig Dispense  Refill   amLODipine (NORVASC) 5 MG tablet Take 1 tablet (5 mg total) by mouth daily. 90 tablet 2   buPROPion (WELLBUTRIN) 100 MG tablet Take 1 tablet (100 mg total) by mouth daily. 90 tablet 2   hydrochlorothiazide (HYDRODIURIL) 25 MG tablet Take 1 tablet (25 mg total) by mouth daily. 90 tablet 2   losartan (COZAAR) 50 MG tablet Take 1 tablet (50 mg total) by mouth daily. 90 tablet 2   tadalafil (CIALIS) 20 MG tablet TAKE 1/2 TO 1 TABLET BY MOUTH EVERY OTHER DAY AS NEEDED FOR ERECTILE DYSRUNCTION 10 tablet 5   No current facility-administered medications on file prior to visit.   Review of Systems  Constitutional:  Positive for fever and malaise/fatigue. Negative for chills.  HENT:  Positive for congestion. Negative for ear pain, sinus pain and sore throat.   Eyes:  Negative for blurred vision, discharge and redness.  Respiratory:  Positive for cough and sputum production. Negative for shortness of breath, wheezing and stridor.   Cardiovascular:  Negative for chest pain, palpitations and leg swelling.  Gastrointestinal:  Negative for abdominal pain, diarrhea, nausea and vomiting.  Musculoskeletal:  Negative for myalgias.  Skin:  Negative for rash.  Neurological:  Positive for headaches. Negative for dizziness.    Observations/Objective: Patient appears well, in no distress Weight is baseline  No facial swelling or asymmetry Mildly hoarse voice, sounds congested No obvious  tremor or mobility impairment Moving neck and UEs normally Able to hear the call well  No cough or shortness of breath during interview  Clears throat occ  Talkative and mentally sharp with no cognitive changes No skin changes on face or neck , no rash or pallor Affect is normal    Assessment and Plan: Problem List Items Addressed This Visit       Other   COVID-19    Pt is over 50 with obesity and moderate symptoms  Qualifies for antiviral tx  molnupiravir sent to pharmacy/discussed poss side eff Discussed  symptom management / analgesic/expectorant/ DM prn (if multi symptom product check ingredients) Fluids/rest Try nasal saline  ER precautions discussed Update if not starting to improve in a week or if worsening   Isolate 5 days min or until symptoms resolve, mask for addn 10       Relevant Medications   molnupiravir EUA (LAGEVRIO) 200 mg CAPS capsule     Follow Up Instructions: Drink fluids and rest  Treat symptoms (can add delsym or change to mucinex DM for cough) Nasal saline is helpful  Monitor fever  Take molnupiravir as directed   If short of breath or severe symptoms please go to the ER Update if not starting to improve in a week or if worsening   Isolate for 5 days minimum (return when symptoms are better)  Mask for additional 10 days from there    I discussed the assessment and treatment plan with the patient. The patient was provided an opportunity to ask questions and all were answered. The patient agreed with the plan and demonstrated an understanding of the instructions.   The patient was advised to call back or seek an in-person evaluation if the symptoms worsen or if the condition fails to improve as anticipated.     Roxy Manns, MD

## 2021-11-11 NOTE — Assessment & Plan Note (Signed)
Pt is over 50 with obesity and moderate symptoms  Qualifies for antiviral tx  molnupiravir sent to pharmacy/discussed poss side eff Discussed symptom management / analgesic/expectorant/ DM prn (if multi symptom product check ingredients) Fluids/rest Try nasal saline  ER precautions discussed Update if not starting to improve in a week or if worsening   Isolate 5 days min or until symptoms resolve, mask for addn 10

## 2021-11-11 NOTE — Patient Instructions (Signed)
Drink fluids and rest  Treat symptoms (can add delsym or change to mucinex DM for cough) Nasal saline is helpful  Monitor fever  Take molnupiravir as directed   If short of breath or severe symptoms please go to the ER Update if not starting to improve in a week or if worsening   Isolate for 5 days minimum (return when symptoms are better)  Mask for additional 10 days from there

## 2021-11-11 NOTE — Telephone Encounter (Signed)
Patient scheduled with provider today.

## 2021-11-11 NOTE — Telephone Encounter (Signed)
Pt is scheduled with [facility] today at 58

## 2021-11-13 ENCOUNTER — Encounter: Payer: Self-pay | Admitting: Family Medicine

## 2021-11-14 ENCOUNTER — Telehealth: Payer: Self-pay | Admitting: Family Medicine

## 2021-11-14 MED ORDER — PREDNISONE 10 MG PO TABS: ORAL_TABLET | ORAL | 0 refills | Status: DC

## 2021-11-14 MED ORDER — ALBUTEROL SULFATE HFA 108 (90 BASE) MCG/ACT IN AERS: 2.0000 | INHALATION_SPRAY | RESPIRATORY_TRACT | 0 refills | Status: DC | PRN

## 2021-11-14 NOTE — Addendum Note (Signed)
Addended by: Roxy Manns A on: 11/14/2021 05:12 PM   Modules accepted: Orders

## 2021-11-14 NOTE — Telephone Encounter (Signed)
Patient called and informed that he could go get his medications from the pharmacy, he was asked if he knows how to use an inhaler and he stated he does and would like one please send in albuterol for patient.

## 2021-11-14 NOTE — Telephone Encounter (Signed)
I sent prednisone to help with wheezing and congestion   Unsure if he has used an albuterol inhaler in the past but if he has or knows how to use it I can send on in as well   If severe shortness of breath go to the ER  Let me know how other symptoms are  Thanks

## 2021-11-14 NOTE — Telephone Encounter (Signed)
Pt called stating that he have tested positive for covid . Pt states that he had a virtual with Dr Milinda Antis on 11/11/21. Pt states that Dr Milinda Antis stated that if he has any wheezing or it get worse to let her know. Pt states that he is wheezing and doesn't feel any better. Please advise.

## 2022-03-01 ENCOUNTER — Other Ambulatory Visit: Payer: Self-pay | Admitting: Family Medicine

## 2022-03-01 DIAGNOSIS — I10 Essential (primary) hypertension: Secondary | ICD-10-CM

## 2022-03-01 DIAGNOSIS — F32A Depression, unspecified: Secondary | ICD-10-CM

## 2022-03-04 ENCOUNTER — Other Ambulatory Visit: Payer: Self-pay | Admitting: Family Medicine

## 2022-03-04 DIAGNOSIS — I10 Essential (primary) hypertension: Secondary | ICD-10-CM

## 2022-03-04 DIAGNOSIS — F32A Depression, unspecified: Secondary | ICD-10-CM

## 2022-03-05 ENCOUNTER — Telehealth: Payer: Self-pay | Admitting: Family Medicine

## 2022-03-05 DIAGNOSIS — F32A Depression, unspecified: Secondary | ICD-10-CM

## 2022-03-05 MED ORDER — BUPROPION HCL 100 MG PO TABS: 100.0000 mg | ORAL_TABLET | Freq: Every day | ORAL | 0 refills | Status: DC

## 2022-03-05 NOTE — Telephone Encounter (Signed)
Pt called in stating that he is almost out of the Wellbutrin, pt can be reached at the home # and he states that he is going out of town.  ? ?Please advise it says it was sent in on 03/03/2022 ?

## 2022-03-05 NOTE — Telephone Encounter (Signed)
Re-sent to pharmacy.

## 2022-05-18 ENCOUNTER — Encounter: Payer: Commercial Managed Care - PPO | Admitting: Family Medicine

## 2022-05-30 ENCOUNTER — Other Ambulatory Visit: Payer: Self-pay | Admitting: Family Medicine

## 2022-05-30 DIAGNOSIS — I10 Essential (primary) hypertension: Secondary | ICD-10-CM

## 2022-05-30 DIAGNOSIS — N529 Male erectile dysfunction, unspecified: Secondary | ICD-10-CM

## 2022-06-01 DIAGNOSIS — B353 Tinea pedis: Secondary | ICD-10-CM | POA: Insufficient documentation

## 2022-06-11 ENCOUNTER — Other Ambulatory Visit: Payer: Self-pay | Admitting: Family Medicine

## 2022-06-11 ENCOUNTER — Other Ambulatory Visit: Payer: Self-pay

## 2022-06-11 DIAGNOSIS — I10 Essential (primary) hypertension: Secondary | ICD-10-CM

## 2022-06-11 DIAGNOSIS — N529 Male erectile dysfunction, unspecified: Secondary | ICD-10-CM

## 2022-06-11 MED ORDER — HYDROCHLOROTHIAZIDE 25 MG PO TABS: 25.0000 mg | ORAL_TABLET | Freq: Every day | ORAL | 0 refills | Status: DC

## 2022-06-11 MED ORDER — LOSARTAN POTASSIUM 50 MG PO TABS: 50.0000 mg | ORAL_TABLET | Freq: Every day | ORAL | 0 refills | Status: DC

## 2022-06-11 MED ORDER — AMLODIPINE BESYLATE 5 MG PO TABS: 5.0000 mg | ORAL_TABLET | Freq: Every day | ORAL | 0 refills | Status: DC

## 2022-06-11 NOTE — Telephone Encounter (Signed)
More information please.  It appears that this medication was sent in December by other provider at time of illness.  Does he have a current illness, or just intermitent use? Will need to discuss that medication further, but potentially could send temporary refill if we can schedule appointment to discuss in more detail.  Let me know.  Thanks.   JG

## 2022-06-11 NOTE — Telephone Encounter (Signed)
Refills sent for a 30 day supply w/ no refills .Must  keep apt in Aug for future refills

## 2022-06-12 ENCOUNTER — Encounter: Payer: Self-pay | Admitting: Family Medicine

## 2022-06-12 DIAGNOSIS — N529 Male erectile dysfunction, unspecified: Secondary | ICD-10-CM

## 2022-06-12 MED ORDER — TADALAFIL 20 MG PO TABS: ORAL_TABLET | ORAL | 0 refills | Status: DC

## 2022-07-09 ENCOUNTER — Other Ambulatory Visit: Payer: Self-pay

## 2022-07-09 ENCOUNTER — Ambulatory Visit (INDEPENDENT_AMBULATORY_CARE_PROVIDER_SITE_OTHER): Payer: Commercial Managed Care - PPO | Admitting: Family Medicine

## 2022-07-09 VITALS — BP 130/72 | HR 65 | Temp 97.6°F | Resp 18 | Ht 75.0 in | Wt 269.8 lb

## 2022-07-09 DIAGNOSIS — I10 Essential (primary) hypertension: Secondary | ICD-10-CM

## 2022-07-09 DIAGNOSIS — N529 Male erectile dysfunction, unspecified: Secondary | ICD-10-CM | POA: Diagnosis not present

## 2022-07-09 DIAGNOSIS — F32A Depression, unspecified: Secondary | ICD-10-CM

## 2022-07-09 DIAGNOSIS — Z125 Encounter for screening for malignant neoplasm of prostate: Secondary | ICD-10-CM

## 2022-07-09 DIAGNOSIS — Z23 Encounter for immunization: Secondary | ICD-10-CM | POA: Diagnosis not present

## 2022-07-09 DIAGNOSIS — Z6833 Body mass index (BMI) 33.0-33.9, adult: Secondary | ICD-10-CM

## 2022-07-09 DIAGNOSIS — E785 Hyperlipidemia, unspecified: Secondary | ICD-10-CM

## 2022-07-09 DIAGNOSIS — E669 Obesity, unspecified: Secondary | ICD-10-CM

## 2022-07-09 DIAGNOSIS — Z1211 Encounter for screening for malignant neoplasm of colon: Secondary | ICD-10-CM

## 2022-07-09 DIAGNOSIS — Z0000 Encounter for general adult medical examination without abnormal findings: Secondary | ICD-10-CM | POA: Diagnosis not present

## 2022-07-09 LAB — COMPREHENSIVE METABOLIC PANEL
ALT: 15 U/L (ref 0–53)
AST: 20 U/L (ref 0–37)
Albumin: 5 g/dL (ref 3.5–5.2)
Alkaline Phosphatase: 68 U/L (ref 39–117)
BUN: 17 mg/dL (ref 6–23)
CO2: 30 mEq/L (ref 19–32)
Calcium: 9.8 mg/dL (ref 8.4–10.5)
Chloride: 98 mEq/L (ref 96–112)
Creatinine, Ser: 0.98 mg/dL (ref 0.40–1.50)
GFR: 89.46 mL/min (ref >60.00)
Glucose, Bld: 83 mg/dL (ref 70–99)
Potassium: 3.8 mEq/L (ref 3.5–5.1)
Sodium: 139 mEq/L (ref 135–145)
Total Bilirubin: 1.1 mg/dL (ref 0.2–1.2)
Total Protein: 7.3 g/dL (ref 6.0–8.3)

## 2022-07-09 LAB — PSA: PSA: 4.35 ng/mL — ABNORMAL HIGH (ref 0.10–4.00)

## 2022-07-09 LAB — LIPID PANEL
Cholesterol: 234 mg/dL — ABNORMAL HIGH (ref 0–200)
HDL: 59.6 mg/dL (ref >39.00)
LDL Cholesterol: 134 mg/dL — ABNORMAL HIGH (ref 0–99)
NonHDL: 174.2
Total CHOL/HDL Ratio: 4
Triglycerides: 199 mg/dL — ABNORMAL HIGH (ref 0.0–149.0)
VLDL: 39.8 mg/dL (ref 0.0–40.0)

## 2022-07-09 LAB — HEMOGLOBIN A1C: Hgb A1c MFr Bld: 5.1 % (ref 4.6–6.5)

## 2022-07-09 MED ORDER — AMLODIPINE BESYLATE 5 MG PO TABS: 5.0000 mg | ORAL_TABLET | Freq: Every day | ORAL | 3 refills | Status: DC

## 2022-07-09 MED ORDER — TADALAFIL 20 MG PO TABS: ORAL_TABLET | ORAL | 11 refills | Status: DC

## 2022-07-09 MED ORDER — BUPROPION HCL 100 MG PO TABS: 100.0000 mg | ORAL_TABLET | Freq: Every day | ORAL | 3 refills | Status: DC

## 2022-07-09 MED ORDER — LOSARTAN POTASSIUM 50 MG PO TABS: 50.0000 mg | ORAL_TABLET | Freq: Every day | ORAL | 3 refills | Status: DC

## 2022-07-09 MED ORDER — HYDROCHLOROTHIAZIDE 25 MG PO TABS: 25.0000 mg | ORAL_TABLET | Freq: Every day | ORAL | 3 refills | Status: DC

## 2022-07-09 NOTE — Progress Notes (Signed)
Subjective:  Patient ID: Cameron Chase, male    DOB: Mar 21, 1971  Age: 51 y.o. MRN: [id number]  CC:  Chief Complaint  Patient presents with   Annual Exam    Patient states he is here for a CPE no other concerns .    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 No new side effects.  Home readings: rare, 120-30/70-80 range.  BP Readings from Last 3 Encounters:  07/09/22 130/72  11/11/21 127/81  04/30/21 114/86   Lab Results  Component Value Date   CREATININE 0.88 04/30/2021   Hyperlipidemia with obesity. No FH of early heart disease.  Low ASCVD risk score, diet/exercise approach.  No current statin Brother type 1 dm, parents on chol meds, no heart disease.  More active. Diet is improving. Still some room for changes.  Wt Readings from Last 3 Encounters:  07/09/22 269 lb 12.8 oz (122.4 kg)  11/11/21 272 lb (123.4 kg)  04/30/21 278 lb (126.1 kg)  Body mass index is 33.72 kg/m. Lab Results  Component Value Date   HGBA1C 5.0 04/30/2021   Lab Results  Component Value Date   CHOL 206 (H) 04/30/2021   HDL 55.00 04/30/2021   LDLCALC 118 (H) 04/30/2021   TRIG 164.0 (H) 04/30/2021   CHOLHDL 4 04/30/2021   Lab Results  Component Value Date   ALT 15 04/30/2021   AST 19 04/30/2021   ALKPHOS 70 04/30/2021   BILITOT 0.8 04/30/2021   The 10-year ASCVD risk score (Arnett DK, et al., 2019) is: 4.3%   Values used to calculate the score:     Age: 44 years     Sex: Male     Is Non-Hispanic African American: No     Diabetic: No     Tobacco smoker: No     Systolic Blood Pressure: 130 mmHg     Is BP treated: Yes     HDL Cholesterol: 55 mg/dL     Total Cholesterol: 206 mg/dL  Depression: Wellbutrin 100 mg daily.  Working well at his physical last year, still doing well.  Son Sophomore son at [employer], daughter Holiday representative at [employer].  Engaged over Christmas, moved in together with her dtrs 4 and 51 yo. Doing well.      07/09/2022    1:09 PM 05/12/2021   12:45 PM 04/30/2021    2:08 PM 01/31/2020    8:08 AM 08/16/2019    8:46 AM  Depression screen PHQ 2/9  Decreased Interest 0 0 0 0 0  Down, Depressed, Hopeless 0 0 0 0 0  PHQ - 2 Score 0 0 0 0 0  Altered sleeping 0 0     Tired, decreased energy 0 1     Change in appetite 0 0     Feeling bad or failure about yourself  0 0     Trouble concentrating 0 0     Moving slowly or fidgety/restless 0 0     Suicidal thoughts 0 0     PHQ-9 Score 0 1     Difficult doing work/chores Not difficult at all        Erectile dysfunction Treated with Cialis 20 mg, working well few times per week, 1/2 pill usually.  No HA/flushing, CP/dyspnea with exertion, visio or hearing changes.   Health Maintenance  Topic Date Due  COLONOSCOPY (Pts 45-23yrs Insurance coverage will need to be confirmed)  Never done   Zoster Vaccines- Shingrix (2 of 2) 06/25/2021   INFLUENZA VACCINE  06/30/2022   COVID-19 Vaccine (3 - Moderna series) 07/25/2022 (Originally 11/20/2020)   TETANUS/TDAP  11/14/2028   Hepatitis C Screening  Completed   HIV Screening  Completed   HPV VACCINES  Aged Out  Colonoscopy - referred last year. Did not return call. Repeat referral and will schedule.  Derm appt next month.  Prostate: does not have family history or personal hx of prostate cancer The natural history of prostate cancer and ongoing controversy regarding screening and potential treatment outcomes of prostate cancer has been discussed with the patient. The meaning of a false positive PSA and a false negative PSA has been discussed. He indicates understanding of the limitations of this screening test and wishes to proceed with screening PSA testing. Elevated in 2021, lower last year.  Lab Results  Component Value Date   PSA1 4.1 (H) 01/31/2020   PSA 3.88 04/30/2021      Immunization History   Administered Date(s) Administered   Influenza,inj,Quad PF,6+ Mos 10/08/2015, 11/14/2018, 08/16/2019   Moderna SARS-COV2 Booster Vaccination 09/25/2020   Moderna Sars-Covid-2 Vaccination 02/02/2020, 03/03/2020   Tdap 11/14/2018   Zoster Recombinat (Shingrix) 04/30/2021  2nd shingix today.  Covid booster last year - bivalent last year - considering repeat with elevated BMI.   No results found. Optho in February, no changes.   Dental: every 6 months.   Alcohol: 5-10 per week.   Tobacco: none  Exercise: 2 times per week, . And walking dogs.    History Patient Active Problem List   Diagnosis Date Noted   COVID-19 11/11/2021   Hyperlipidemia 02/07/2016   Erectile dysfunction 07/29/2012   Benign hypertension 07/18/2012   Depression 07/18/2012   Past Medical History:  Diagnosis Date   Depression    Hypertension    No past surgical history on file. No Known Allergies Prior to Admission medications   Medication Sig Start Date End Date Taking? Authorizing Provider  albuterol (VENTOLIN HFA) 108 (90 Base) MCG/ACT inhaler INHALE 2 PUFFS BY MOUTH EVERY 4 HOURS AS NEEDED FOR WHEEZING 03/02/22  Yes Tower, Marne A, MD  amLODipine (NORVASC) 5 MG tablet Take 1 tablet (5 mg total) by mouth daily. 06/11/22  Yes Shade Flood, MD  buPROPion (WELLBUTRIN) 100 MG tablet Take 1 tablet (100 mg total) by mouth daily. 03/05/22  Yes Shade Flood, MD  hydrochlorothiazide (HYDRODIURIL) 25 MG tablet Take 1 tablet (25 mg total) by mouth daily. 06/11/22  Yes Shade Flood, MD  losartan (COZAAR) 50 MG tablet Take 1 tablet (50 mg total) by mouth daily. 06/11/22  Yes Shade Flood, MD  predniSONE (DELTASONE) 10 MG tablet Take 4 pills once daily by mouth for 3 days, then 3 pills daily for 3 days, then 2 pills daily for 3 days then 1 pill daily for 3 days then stop 11/14/21  Yes Tower, Audrie Gallus, MD  tadalafil (CIALIS) 20 MG tablet TAKE 1/2 TO 1 TABLET BY MOUTH EVERY OTHER DAY AS NEEDED FOR  ERECTILE DYSRUNCTION 06/12/22  Yes Shade Flood, MD   Social History   Socioeconomic History   Marital status: Divorced    Spouse name: Not on file   Number of children: Not on file   Years of education: Not on file   Highest education level: Not on file  Occupational History   Not on file  Tobacco  Use   Smoking status: Never   Smokeless tobacco: Never  Substance and Sexual Activity   Alcohol use: Yes   Drug use: No   Sexual activity: Not on file  Other Topics Concern   Not on file  Social History Narrative   Not on file   Social Determinants of Health   Financial Resource Strain: Not on file  Food Insecurity: Not on file  Transportation Needs: Not on file  Physical Activity: Not on file  Stress: Not on file  Social Connections: Not on file  Intimate Partner Violence: Not on file    Review of Systems  13 point review of systems per patient health survey noted.  Negative other than as indicated above or in HPI.   Objective:   Vitals:   07/09/22 1307  BP: 130/72  Pulse: 65  Resp: 18  Temp: 97.6 F (36.4 C)  TempSrc: Temporal  SpO2: 99%  Weight: 269 lb 12.8 oz (122.4 kg)  Height: 6\' 3"  (1.905 m)     Physical Exam Vitals reviewed.  Constitutional:      Appearance: He is well-developed.  HENT:     Head: Normocephalic and atraumatic.     Right Ear: External ear normal.     Left Ear: External ear normal.  Eyes:     Conjunctiva/sclera: Conjunctivae normal.     Pupils: Pupils are equal, round, and reactive to light.  Neck:     Thyroid: No thyromegaly.  Cardiovascular:     Rate and Rhythm: Normal rate and regular rhythm.     Heart sounds: Normal heart sounds.  Pulmonary:     Effort: Pulmonary effort is normal. No respiratory distress.     Breath sounds: Normal breath sounds. No wheezing.  Abdominal:     General: There is no distension.     Palpations: Abdomen is soft.     Tenderness: There is no abdominal tenderness.  Musculoskeletal:         General: No tenderness. Normal range of motion.     Cervical back: Normal range of motion and neck supple.  Lymphadenopathy:     Cervical: No cervical adenopathy.  Skin:    General: Skin is warm and dry.  Neurological:     Mental Status: He is alert and oriented to person, place, and time.     Deep Tendon Reflexes: Reflexes are normal and symmetric.  Psychiatric:        Behavior: Behavior normal.        Assessment & Plan:  GEZA BERANEK is a 51 y.o. male . Annual physical exam  - -anticipatory guidance as below in AVS, screening labs above. Health maintenance items as above in HPI discussed/recommended as applicable.   Screening for prostate cancer - Plan: PSA  -Borderline elevations previously, lower last year.  No family history of prostate cancer or symptoms.  Check PSA and if still elevated or borderline elevated option of urology eval.  Essential hypertension - Plan: Comprehensive metabolic panel, hydrochlorothiazide (HYDRODIURIL) 25 MG tablet, amLODipine (NORVASC) 5 MG tablet, losartan (COZAAR) 50 MG tablet  -  Stable, tolerating current regimen. Medications refilled. Labs pending as above.   Erectile dysfunction, unspecified erectile dysfunction type - Plan: tadalafil (CIALIS) 20 MG tablet  - cialis Rx given - use lowest effective dose. Side effects discussed (including but not limited to headache/flushing, blue discoloration of vision, possible vascular steal and risk of cardiac effects if underlying unknown coronary artery disease, and permanent sensorineural hearing loss). Understanding expressed.  Depression, unspecified depression type - Plan: buPROPion (WELLBUTRIN) 100 MG tablet  -  Stable, tolerating current regimen. Medications refilled. decided to remain on the meds for now.  Option to taper off at future visit if symptoms well-controlled.  Hyperlipidemia, unspecified hyperlipidemia type - Plan: Comprehensive metabolic panel, Lipid panel, Hemoglobin A1c  -Check  labs, repeat ASCVD risk score, no family history of heart disease noted.  Hold on meds for now.  Continue to watch diet, exercise.  Need for shingles vaccine Second dose given.  Screen for colon cancer - Plan: Ambulatory referral to Gastroenterology  Class 1 obesity with body mass index (BMI) of 33.0 to 33.9 in adult, unspecified obesity type, unspecified whether serious comorbidity present   -As above, diet exercise approach.  Commended on improvements.  Meds ordered this encounter  Medications   tadalafil (CIALIS) 20 MG tablet    Sig: TAKE 1/2 TO 1 TABLET BY MOUTH EVERY OTHER DAY AS NEEDED FOR ERECTILE DYSRUNCTION    Dispense:  10 tablet    Refill:  11   buPROPion (WELLBUTRIN) 100 MG tablet    Sig: Take 1 tablet (100 mg total) by mouth daily.    Dispense:  90 tablet    Refill:  3   hydrochlorothiazide (HYDRODIURIL) 25 MG tablet    Sig: Take 1 tablet (25 mg total) by mouth daily.    Dispense:  90 tablet    Refill:  3   amLODipine (NORVASC) 5 MG tablet    Sig: Take 1 tablet (5 mg total) by mouth daily.    Dispense:  90 tablet    Refill:  3   losartan (COZAAR) 50 MG tablet    Sig: Take 1 tablet (50 mg total) by mouth daily.    Dispense:  90 tablet    Refill:  3   Patient Instructions  Second and final shingles vaccine given today.  Option of repeat COVID bivalent booster at your pharmacy.  Flu vaccine when available. I will repeat referral to gastroenterology for colonoscopy. No medication changes for now.  If any concerns on labs I will let you know.  Keep up the good work with watching diet, exercise and I suspect weight will continue to improve.  Recheck in 1 year for physical, sooner if new concerns.  Take care.   Preventive Care 5940-51 Years Old, Male Preventive care refers to lifestyle choices and visits with your health care provider that can promote health and wellness. Preventive care visits are also called wellness exams. What can I expect for my preventive care  visit? Counseling During your preventive care visit, your health care provider may ask about your: Medical history, including: Past medical problems. Family medical history. Current health, including: Emotional well-being. Home life and relationship well-being. Sexual activity. Lifestyle, including: Alcohol, nicotine or tobacco, and drug use. Access to firearms. Diet, exercise, and sleep habits. Safety issues such as seatbelt and bike helmet use. Sunscreen use. Work and work Astronome[employer]. Physical exam Your health care provider will check your: Height and weight. These may be used to calculate your BMI (body mass index). BMI is a measurement that tells if you are at a healthy weight. Waist circumference. This measures the distance around your waistline. This measurement also tells if you are at a healthy weight and may help predict your risk of certain diseases, such as type 2 diabetes and high blood pressure. Heart rate and blood pressure. Body temperature. Skin for abnormal spots. What immunizations do I need?  Vaccines are usually  given at various ages, according to a schedule. Your health care provider will recommend vaccines for you based on your age, medical history, and lifestyle or other factors, such as travel or where you work. What tests do I need? Screening Your health care provider may recommend screening tests for certain conditions. This may include: Lipid and cholesterol levels. Diabetes screening. This is done by checking your blood sugar (glucose) after you have not eaten for a while (fasting). Hepatitis B test. Hepatitis C test. HIV (human immunodeficiency virus) test. STI (sexually transmitted infection) testing, if you are at risk. Lung cancer screening. Prostate cancer screening. Colorectal cancer screening. Talk with your health care provider about your test results, treatment options, and if necessary, the need for more tests. Follow these instructions  at home: Eating and drinking  Eat a diet that includes fresh fruits and vegetables, whole grains, lean protein, and low-fat dairy products. Take vitamin and mineral supplements as recommended by your health care provider. Do not drink alcohol if your health care provider tells you not to drink. If you drink alcohol: Limit how much you have to 0-2 drinks a day. Know how much alcohol is in your drink. In the [place] one drink equals one 12 oz bottle of beer (355 mL), one 5 oz glass of wine (148 mL), or one 1 oz glass of hard liquor (44 mL). Lifestyle Brush your teeth every morning and night with fluoride toothpaste. Floss one time each day. Exercise for at least 30 minutes 5 or more days each week. Do not use any products that contain nicotine or tobacco. These products include cigarettes, chewing tobacco, and vaping devices, such as e-cigarettes. If you need help quitting, ask your health care provider. Do not use drugs. If you are sexually active, practice safe sex. Use a condom or other form of protection to prevent STIs. Take aspirin only as told by your health care provider. Make sure that you understand how much to take and what form to take. Work with your health care provider to find out whether it is safe and beneficial for you to take aspirin daily. Find healthy ways to manage stress, such as: Meditation, yoga, or listening to music. Journaling. Talking to a trusted person. Spending time with friends and family. Minimize exposure to UV radiation to reduce your risk of skin cancer. Safety Always wear your seat belt while driving or riding in a vehicle. Do not drive: If you have been drinking alcohol. Do not ride with someone who has been drinking. When you are tired or distracted. While texting. If you have been using any mind-altering substances or drugs. Wear a helmet and other protective equipment during sports activities. If you have firearms in your house, make sure you  follow all gun safety procedures. What's next? Go to your health care provider once a year for an annual wellness visit. Ask your health care provider how often you should have your eyes and teeth checked. Stay up to date on all vaccines. This information is not intended to replace advice given to you by your health care provider. Make sure you discuss any questions you have with your health care provider. Document Revised: 05/14/2021 Document Reviewed: 05/14/2021 Elsevier Patient Education  2023 Elsevier Inc.     Signed,   Meredith Staggers, MD [facility], [facility] 07/09/22 1:52 PM

## 2022-07-09 NOTE — Patient Instructions (Signed)
Second and final shingles vaccine given today.  Option of repeat COVID bivalent booster at your pharmacy.  Flu vaccine when available. I will repeat referral to gastroenterology for colonoscopy. No medication changes for now.  If any concerns on labs I will let you know.  Keep up the good work with watching diet, exercise and I suspect weight will continue to improve.  Recheck in 1 year for physical, sooner if new concerns.  Take care.   Preventive Care 51-38 Years Old, Male Preventive care refers to lifestyle choices and visits with your health care provider that can promote health and wellness. Preventive care visits are also called wellness exams. What can I expect for my preventive care visit? Counseling During your preventive care visit, your health care provider may ask about your: Medical history, including: Past medical problems. Family medical history. Current health, including: Emotional well-being. Home life and relationship well-being. Sexual activity. Lifestyle, including: Alcohol, nicotine or tobacco, and drug use. Access to firearms. Diet, exercise, and sleep habits. Safety issues such as seatbelt and bike helmet use. Sunscreen use. Work and work Astronomer. Physical exam Your health care provider will check your: Height and weight. These may be used to calculate your BMI (body mass index). BMI is a measurement that tells if you are at a healthy weight. Waist circumference. This measures the distance around your waistline. This measurement also tells if you are at a healthy weight and may help predict your risk of certain diseases, such as type 2 diabetes and high blood pressure. Heart rate and blood pressure. Body temperature. Skin for abnormal spots. What immunizations do I need?  Vaccines are usually given at various ages, according to a schedule. Your health care provider will recommend vaccines for you based on your age, medical history, and lifestyle or other  factors, such as travel or where you work. What tests do I need? Screening Your health care provider may recommend screening tests for certain conditions. This may include: Lipid and cholesterol levels. Diabetes screening. This is done by checking your blood sugar (glucose) after you have not eaten for a while (fasting). Hepatitis B test. Hepatitis C test. HIV (human immunodeficiency virus) test. STI (sexually transmitted infection) testing, if you are at risk. Lung cancer screening. Prostate cancer screening. Colorectal cancer screening. Talk with your health care provider about your test results, treatment options, and if necessary, the need for more tests. Follow these instructions at home: Eating and drinking  Eat a diet that includes fresh fruits and vegetables, whole grains, lean protein, and low-fat dairy products. Take vitamin and mineral supplements as recommended by your health care provider. Do not drink alcohol if your health care provider tells you not to drink. If you drink alcohol: Limit how much you have to 0-2 drinks a day. Know how much alcohol is in your drink. In the [place] one drink equals one 12 oz bottle of beer (355 mL), one 5 oz glass of wine (148 mL), or one 1 oz glass of hard liquor (44 mL). Lifestyle Brush your teeth every morning and night with fluoride toothpaste. Floss one time each day. Exercise for at least 30 minutes 5 or more days each week. Do not use any products that contain nicotine or tobacco. These products include cigarettes, chewing tobacco, and vaping devices, such as e-cigarettes. If you need help quitting, ask your health care provider. Do not use drugs. If you are sexually active, practice safe sex. Use a condom or other form of protection to  prevent STIs. Take aspirin only as told by your health care provider. Make sure that you understand how much to take and what form to take. Work with your health care provider to find out whether it is  safe and beneficial for you to take aspirin daily. Find healthy ways to manage stress, such as: Meditation, yoga, or listening to music. Journaling. Talking to a trusted person. Spending time with friends and family. Minimize exposure to UV radiation to reduce your risk of skin cancer. Safety Always wear your seat belt while driving or riding in a vehicle. Do not drive: If you have been drinking alcohol. Do not ride with someone who has been drinking. When you are tired or distracted. While texting. If you have been using any mind-altering substances or drugs. Wear a helmet and other protective equipment during sports activities. If you have firearms in your house, make sure you follow all gun safety procedures. What's next? Go to your health care provider once a year for an annual wellness visit. Ask your health care provider how often you should have your eyes and teeth checked. Stay up to date on all vaccines. This information is not intended to replace advice given to you by your health care provider. Make sure you discuss any questions you have with your health care provider. Document Revised: 05/14/2021 Document Reviewed: 05/14/2021 Elsevier Patient Education  2023 [employer].

## 2022-07-17 ENCOUNTER — Other Ambulatory Visit: Payer: Self-pay | Admitting: Family Medicine

## 2022-07-17 DIAGNOSIS — R972 Elevated prostate specific antigen [PSA]: Secondary | ICD-10-CM

## 2022-07-17 NOTE — Progress Notes (Signed)
See labs.  Referring to urology for elevated PSA.

## 2022-09-08 ENCOUNTER — Telehealth: Payer: Self-pay

## 2022-09-08 NOTE — Telephone Encounter (Signed)
Called pt. Pt reported attempts yesterday and at 8am to call to cancel d/t family emergency. He wishes to cancel both PV and colonoscopy at this time. He will call back to reschedule.

## 2022-09-08 NOTE — Telephone Encounter (Signed)
NoShow for PV 8am. Voicemail (identified) left for pt to call back to reschedule PV. If no call back today will need to reschedule procedure.

## 2022-09-29 ENCOUNTER — Encounter: Payer: Commercial Managed Care - PPO | Admitting: Gastroenterology

## 2022-10-10 ENCOUNTER — Other Ambulatory Visit: Payer: Self-pay | Admitting: Urology

## 2022-10-10 DIAGNOSIS — R972 Elevated prostate specific antigen [PSA]: Secondary | ICD-10-CM

## 2022-11-01 ENCOUNTER — Other Ambulatory Visit: Payer: Self-pay | Admitting: Family Medicine

## 2022-11-25 ENCOUNTER — Other Ambulatory Visit: Payer: Commercial Managed Care - PPO

## 2023-06-26 ENCOUNTER — Other Ambulatory Visit: Payer: Self-pay | Admitting: Family Medicine

## 2023-06-26 DIAGNOSIS — I10 Essential (primary) hypertension: Secondary | ICD-10-CM

## 2023-06-30 ENCOUNTER — Encounter (INDEPENDENT_AMBULATORY_CARE_PROVIDER_SITE_OTHER): Payer: Self-pay

## 2023-07-14 ENCOUNTER — Ambulatory Visit (INDEPENDENT_AMBULATORY_CARE_PROVIDER_SITE_OTHER): Payer: Commercial Managed Care - PPO | Admitting: Family Medicine

## 2023-07-14 ENCOUNTER — Encounter: Payer: Self-pay | Admitting: Family Medicine

## 2023-07-14 VITALS — BP 122/68 | HR 63 | Temp 98.0°F | Ht 74.75 in | Wt 269.8 lb

## 2023-07-14 DIAGNOSIS — Z1211 Encounter for screening for malignant neoplasm of colon: Secondary | ICD-10-CM

## 2023-07-14 DIAGNOSIS — R972 Elevated prostate specific antigen [PSA]: Secondary | ICD-10-CM | POA: Diagnosis not present

## 2023-07-14 DIAGNOSIS — Z131 Encounter for screening for diabetes mellitus: Secondary | ICD-10-CM | POA: Diagnosis not present

## 2023-07-14 DIAGNOSIS — Z125 Encounter for screening for malignant neoplasm of prostate: Secondary | ICD-10-CM

## 2023-07-14 DIAGNOSIS — N529 Male erectile dysfunction, unspecified: Secondary | ICD-10-CM

## 2023-07-14 DIAGNOSIS — Z0000 Encounter for general adult medical examination without abnormal findings: Secondary | ICD-10-CM

## 2023-07-14 DIAGNOSIS — I10 Essential (primary) hypertension: Secondary | ICD-10-CM | POA: Diagnosis not present

## 2023-07-14 DIAGNOSIS — F32A Depression, unspecified: Secondary | ICD-10-CM

## 2023-07-14 DIAGNOSIS — E785 Hyperlipidemia, unspecified: Secondary | ICD-10-CM | POA: Diagnosis not present

## 2023-07-14 LAB — CBC WITH DIFFERENTIAL/PLATELET
Basophils Absolute: 0.1 10*3/uL (ref 0.0–0.1)
Basophils Relative: 1.1 % (ref 0.0–3.0)
Eosinophils Absolute: 0.1 10*3/uL (ref 0.0–0.7)
Eosinophils Relative: 2.8 % (ref 0.0–5.0)
HCT: 43.1 % (ref 39.0–52.0)
Hemoglobin: 14.5 g/dL (ref 13.0–17.0)
Lymphocytes Relative: 15.6 % (ref 12.0–46.0)
Lymphs Abs: 0.8 10*3/uL (ref 0.7–4.0)
MCHC: 33.6 g/dL (ref 30.0–36.0)
MCV: 88.6 fl (ref 78.0–100.0)
Monocytes Absolute: 0.5 10*3/uL (ref 0.1–1.0)
Monocytes Relative: 9.5 % (ref 3.0–12.0)
Neutro Abs: 3.5 10*3/uL (ref 1.4–7.7)
Neutrophils Relative %: 71 % (ref 43.0–77.0)
Platelets: 268 10*3/uL (ref 150.0–400.0)
RBC: 4.87 Mil/uL (ref 4.22–5.81)
RDW: 13 % (ref 11.5–15.5)
WBC: 5 10*3/uL (ref 4.0–10.5)

## 2023-07-14 LAB — LIPID PANEL
Cholesterol: 198 mg/dL (ref 0–200)
HDL: 60.6 mg/dL (ref >39.00)
LDL Cholesterol: 116 mg/dL — ABNORMAL HIGH (ref 0–99)
NonHDL: 137.41
Total CHOL/HDL Ratio: 3
Triglycerides: 105 mg/dL (ref 0.0–149.0)
VLDL: 21 mg/dL (ref 0.0–40.0)

## 2023-07-14 LAB — COMPREHENSIVE METABOLIC PANEL
ALT: 14 U/L (ref 0–53)
AST: 20 U/L (ref 0–37)
Albumin: 4.7 g/dL (ref 3.5–5.2)
Alkaline Phosphatase: 62 U/L (ref 39–117)
BUN: 15 mg/dL (ref 6–23)
CO2: 29 mEq/L (ref 19–32)
Calcium: 9.7 mg/dL (ref 8.4–10.5)
Chloride: 102 mEq/L (ref 96–112)
Creatinine, Ser: 0.95 mg/dL (ref 0.40–1.50)
GFR: 92.2 mL/min (ref >60.00)
Glucose, Bld: 94 mg/dL (ref 70–99)
Potassium: 4.3 mEq/L (ref 3.5–5.1)
Sodium: 138 mEq/L (ref 135–145)
Total Bilirubin: 0.7 mg/dL (ref 0.2–1.2)
Total Protein: 6.9 g/dL (ref 6.0–8.3)

## 2023-07-14 LAB — PSA: PSA: 4.96 ng/mL — ABNORMAL HIGH (ref 0.10–4.00)

## 2023-07-14 LAB — HEMOGLOBIN A1C: Hgb A1c MFr Bld: 4.9 % (ref 4.6–6.5)

## 2023-07-14 MED ORDER — LOSARTAN POTASSIUM 50 MG PO TABS
50.0000 mg | ORAL_TABLET | Freq: Every day | ORAL | 3 refills | Status: DC
Start: 2023-07-14 — End: 2024-07-17

## 2023-07-14 MED ORDER — BUPROPION HCL 100 MG PO TABS
100.0000 mg | ORAL_TABLET | Freq: Every day | ORAL | 3 refills | Status: DC
Start: 2023-07-14 — End: 2024-09-12

## 2023-07-14 MED ORDER — AMLODIPINE BESYLATE 5 MG PO TABS
5.0000 mg | ORAL_TABLET | Freq: Every day | ORAL | 3 refills | Status: DC
Start: 2023-07-14 — End: 2024-07-17

## 2023-07-14 MED ORDER — HYDROCHLOROTHIAZIDE 25 MG PO TABS
25.0000 mg | ORAL_TABLET | Freq: Every day | ORAL | 3 refills | Status: DC
Start: 2023-07-14 — End: 2024-07-17

## 2023-07-14 MED ORDER — TADALAFIL 20 MG PO TABS
ORAL_TABLET | ORAL | 11 refills | Status: DC
Start: 2023-07-14 — End: 2024-07-19

## 2023-07-14 NOTE — Progress Notes (Signed)
Subjective:  Patient ID: Cameron Chase, male    DOB: [birth year]  Age: 52 y.o. MRN: [id number]  CC:  Chief Complaint  Patient presents with   Annual Exam    Pt is doing well, fasting     HPI Cameron Chase presents for Annual Exam.  No acute concerns.  Son is a Holiday representative at [employer] daughter senior at [employer].  newly married - few weeks ago. when discussed last year.  Things are going well.[place] for honeymoon.  [place] trip with family few months ago.   Hypertension: Amlodipine 5 mg daily, losartan 50 mg daily, hydrochlorothiazide 25 mg daily. Home readings: none,  no med side effects.  BP Readings from Last 3 Encounters:  07/14/23 122/68  07/09/22 130/72  11/11/21 127/81   Lab Results  Component Value Date   CREATININE 0.98 07/09/2022   Erectile dysfunction Doing well with use of Cialis, no hearing or vision changes, no chest pain or shortness of breath with exertion.  Possible side effects and risk discussed.  Hyperlipidemia with obesity Treated with diet/exercise.  Family history of hyperlipidemia, both parents on cholesterol meds but no known heart disease.  Brother with type 1 diabetes.  Diet was improving at his last physical in August 2023. Still exercising.  Lab Results  Component Value Date   HGBA1C 5.1 07/09/2022   The 10-year ASCVD risk score (Arnett DK, et al., 2019) is: 4.7%   Values used to calculate the score:     Age: 21 years     Sex: Male     Is Non-Hispanic African American: No     Diabetic: No     Tobacco smoker: No     Systolic Blood Pressure: 122 mmHg     Is BP treated: Yes     HDL Cholesterol: 59.6 mg/dL     Total Cholesterol: 234 mg/dL   Wt Readings from Last 3 Encounters:  07/14/23 269 lb 12.8 oz (122.4 kg)  07/09/22 269 lb 12.8 oz (122.4 kg)  11/11/21 272 lb (123.4 kg)   Body mass index is 33.95 kg/m.  Lab Results  Component Value Date   CHOL 234 (H) 07/09/2022   HDL 59.60 07/09/2022   LDLCALC 134 (H) 07/09/2022   TRIG  199.0 (H) 07/09/2022   CHOLHDL 4 07/09/2022   Lab Results  Component Value Date   ALT 15 07/09/2022   AST 20 07/09/2022   ALKPHOS 68 07/09/2022   BILITOT 1.1 07/09/2022    Depression: Treated with Wellbutrin 100 mg daily.     07/14/2023    8:10 AM 07/09/2022    1:09 PM 05/12/2021   12:45 PM 04/30/2021    2:08 PM 01/31/2020    8:08 AM  Depression screen PHQ 2/9  Decreased Interest 0 0 0 0 0  Down, Depressed, Hopeless 0 0 0 0 0  PHQ - 2 Score 0 0 0 0 0  Altered sleeping 0 0 0    Tired, decreased energy 0 0 1    Change in appetite 0 0 0    Feeling bad or failure about yourself  0 0 0    Trouble concentrating 0 0 0    Moving slowly or fidgety/restless 0 0 0    Suicidal thoughts 0 0 0    PHQ-9 Score 0 0 1    Difficult doing work/chores  Not difficult at all        Health Maintenance  Topic Date Due   Colonoscopy  Never done   COVID-19 Vaccine (4 - 2023-24 season) 07/31/2022   INFLUENZA VACCINE  07/01/2023   DTaP/Tdap/Td (2 - Td or Tdap) 11/14/2028   Hepatitis C Screening  Completed   HIV Screening  Completed   Zoster Vaccines- Shingrix  Completed   HPV VACCINES  Aged Out  Referred for colonoscopy 2 years ago, with plan to call after his last physical.  Phone number provided for [facility] gastroenterology. Did not have colonoscopy.  Prostate: Elevated PSA last year, referred to urology.  Note reviewed from 10/08/2022.Plan for prostate biopsy with prostate MRI ordered. Did not have either test and no follow up.  We discussed pros and cons of prostate cancer screening, and after this discussion, he chose to have screening done. PSA obtained, and no concerning findings on DRE.    Lab Results  Component Value Date   PSA1 4.1 (H) 01/31/2020   PSA 4.35 (H) 07/09/2022   PSA 3.88 04/30/2021  .   Immunization History  Administered Date(s) Administered   Influenza,inj,Quad PF,6+ Mos 10/08/2015, 11/14/2018, 08/16/2019   Moderna SARS-COV2 Booster Vaccination 09/25/2020   Moderna  Sars-Covid-2 Vaccination 02/02/2020, 03/03/2020   Tdap 11/14/2018   Zoster Recombinant(Shingrix) 04/30/2021, 07/09/2022  Covid booster:last summer, recommended updated booster with flu vaccine.   Vision Screening   Right eye Left eye Both eyes  Without correction 20/20 20/20 20/20   With correction     Optho - few years ago. 2 year follow up.   Dental: every 6 months.   Alcohol: 12 per week.   Tobacco: none,  occasional nicotine gum. Quit 12 years ago - 1/4-1/2 ppd for 20yrs.   Exercise: 45 min 4 days per week.    History Patient Active Problem List   Diagnosis Date Noted   COVID-19 11/11/2021   Hyperlipidemia 02/07/2016   Erectile dysfunction 07/29/2012   Benign hypertension 07/18/2012   Depression 07/18/2012   Past Medical History:  Diagnosis Date   Depression    Hypertension    History reviewed. No pertinent surgical history. No Known Allergies Prior to Admission medications   Medication Sig Start Date End Date Taking? Authorizing Provider  albuterol (VENTOLIN HFA) 108 (90 Base) MCG/ACT inhaler INHALE 2 PUFFS BY MOUTH EVERY 4 HOURS AS NEEDED FOR WHEEZING 03/02/22  Yes Tower, Marne A, MD  amLODipine (NORVASC) 5 MG tablet Take 1 tablet (5 mg total) by mouth daily. 07/09/22  Yes Shade Flood, MD  buPROPion (WELLBUTRIN) 100 MG tablet Take 1 tablet (100 mg total) by mouth daily. 07/09/22  Yes Shade Flood, MD  hydrochlorothiazide (HYDRODIURIL) 25 MG tablet Take 1 tablet by mouth once daily 06/28/23  Yes Shade Flood, MD  losartan (COZAAR) 50 MG tablet Take 1 tablet (50 mg total) by mouth daily. 07/09/22  Yes Shade Flood, MD  Multiple Vitamin (MULTIVITAMIN WITH MINERALS) TABS tablet Take 1 tablet by mouth daily.   Yes [provider]  tadalafil (CIALIS) 20 MG tablet TAKE 1/2 TO 1 TABLET BY MOUTH EVERY OTHER DAY AS NEEDED FOR ERECTILE DYSRUNCTION 07/09/22  Yes Shade Flood, MD  clotrimazole-betamethasone (LOTRISONE) cream Apply topically  daily. Patient not taking: Reported on 07/14/2023 05/29/22   [provider]   Social History   Socioeconomic History   Marital status: Married    Spouse name: Not on file   Number of children: Not on file   Years of education: Not on file   Highest education level: Not on file  Occupational History   Not on file  Tobacco Use   Smoking status: Never   Smokeless tobacco: Never   Tobacco comments:    Pt uses nicotine gum   Substance and Sexual Activity   Alcohol use: Not Currently    Alcohol/week: 12.0 standard drinks of alcohol    Types: 12 Standard drinks or equivalent per week    Comment: beer and wine apx 12 drinks a week   Drug use: Yes    Types: Marijuana    Comment: once a month or less   Sexual activity: Yes  Other Topics Concern   Not on file  Social History Narrative   Married to second wife in August 2024   Social Determinants of Health   Financial Resource Strain: Not on file  Food Insecurity: Not on file  Transportation Needs: Not on file  Physical Activity: Not on file  Stress: Not on file  Social Connections: Not on file  Intimate Partner Violence: Not on file    Review of Systems  13 point review of systems per patient health survey noted.  Negative other than as indicated above or in HPI.   Objective:   Vitals:   07/14/23 0814  BP: 122/68  Pulse: 63  Temp: 98 F (36.7 C)  TempSrc: Temporal  SpO2: 98%  Weight: 269 lb 12.8 oz (122.4 kg)  Height: 6' 2.75" (1.899 m)     Physical Exam Vitals reviewed.  Constitutional:      Appearance: He is well-developed.  HENT:     Head: Normocephalic and atraumatic.     Right Ear: External ear normal.     Left Ear: External ear normal.  Eyes:     Conjunctiva/sclera: Conjunctivae normal.     Pupils: Pupils are equal, round, and reactive to light.  Neck:     Thyroid: No thyromegaly.  Cardiovascular:     Rate and Rhythm: Normal rate and regular rhythm.     Heart sounds: Normal heart sounds.   Pulmonary:     Effort: Pulmonary effort is normal. No respiratory distress.     Breath sounds: Normal breath sounds. No wheezing.  Abdominal:     General: There is no distension.     Palpations: Abdomen is soft.     Tenderness: There is no abdominal tenderness.  Musculoskeletal:        General: No tenderness. Normal range of motion.     Cervical back: Normal range of motion and neck supple.  Lymphadenopathy:     Cervical: No cervical adenopathy.  Skin:    General: Skin is warm and dry.  Neurological:     Mental Status: He is alert and oriented to person, place, and time.     Deep Tendon Reflexes: Reflexes are normal and symmetric.  Psychiatric:        Behavior: Behavior normal.        Assessment & Plan:  Cameron Chase is a 52 y.o. male . Annual physical exam - Plan: CBC with Differential/Platelet, Comprehensive metabolic panel, Lipid panel, PSA, Hemoglobin A1c  - -anticipatory guidance as below in AVS, screening labs above. Health maintenance items as above in HPI discussed/recommended as applicable.   Essential hypertension - Plan: CBC with Differential/Platelet, hydrochlorothiazide (HYDRODIURIL) 25 MG tablet, amLODipine (NORVASC) 5 MG tablet, losartan (COZAAR) 50 MG tablet  -  Stable, tolerating current regimen. Medications refilled. Labs pending as above.   Elevated PSA - Plan: PSA Screening for prostate cancer - Plan: PSA  -Previous elevated PSA, did not proceed with biopsy and MRI as  planned.  Requested repeat PSA today, but if that is still elevated will need to follow-up with urology to discuss prior recommendations and plan further.  Hyperlipidemia, unspecified hyperlipidemia type - Plan: Comprehensive metabolic panel, Lipid panel  -Diet/exercise approach, low ASCVD risk score previously.  Check labs and adjust plan accordingly  Screening for diabetes mellitus - Plan: Comprehensive metabolic panel, Hemoglobin A1c  Depression, unspecified depression type - Plan:  buPROPion (WELLBUTRIN) 100 MG tablet  -Stable, continue Wellbutrin same dose.  Erectile dysfunction, unspecified erectile dysfunction type - Plan: tadalafil (CIALIS) 20 MG tablet  -Stable with current use of Cialis, lowest effective dose.  Potential risks, side effects discussed.  Screening for colon cancer - Plan: Ambulatory referral to Gastroenterology  -Importance of follow-up for colonoscopy discussed.  Repeat referral ordered.  Meds ordered this encounter  Medications   buPROPion (WELLBUTRIN) 100 MG tablet    Sig: Take 1 tablet (100 mg total) by mouth daily.    Dispense:  90 tablet    Refill:  3   tadalafil (CIALIS) 20 MG tablet    Sig: TAKE 1/2 TO 1 TABLET BY MOUTH EVERY OTHER DAY AS NEEDED FOR ERECTILE DYSRUNCTION    Dispense:  10 tablet    Refill:  11   hydrochlorothiazide (HYDRODIURIL) 25 MG tablet    Sig: Take 1 tablet (25 mg total) by mouth daily.    Dispense:  90 tablet    Refill:  3   amLODipine (NORVASC) 5 MG tablet    Sig: Take 1 tablet (5 mg total) by mouth daily.    Dispense:  90 tablet    Refill:  3   losartan (COZAAR) 50 MG tablet    Sig: Take 1 tablet (50 mg total) by mouth daily.    Dispense:  90 tablet    Refill:  3   Patient Instructions  Continue to watch diet, exercise, and minimizing alcohol can also help with weight management. If any lab concerns I will let you know.  It is very important that you have colonoscopy. I will refer you once again.  I will check PSA, but if it is still elevated, need to follow up with urology. Please call their office.  No med changes today. Take care!  Preventive Care 26-66 Years Old, Male Preventive care refers to lifestyle choices and visits with your health care provider that can promote health and wellness. Preventive care visits are also called wellness exams. What can I expect for my preventive care visit? Counseling During your preventive care visit, your health care provider may ask about your: Medical  history, including: Past medical problems. Family medical history. Current health, including: Emotional well-being. Home life and relationship well-being. Sexual activity. Lifestyle, including: Alcohol, nicotine or tobacco, and drug use. Access to firearms. Diet, exercise, and sleep habits. Safety issues such as seatbelt and bike helmet use. Sunscreen use. Work and work Astronomer. Physical exam Your health care provider will check your: Height and weight. These may be used to calculate your BMI (body mass index). BMI is a measurement that tells if you are at a healthy weight. Waist circumference. This measures the distance around your waistline. This measurement also tells if you are at a healthy weight and may help predict your risk of certain diseases, such as type 2 diabetes and high blood pressure. Heart rate and blood pressure. Body temperature. Skin for abnormal spots. What immunizations do I need?  Vaccines are usually given at various ages, according to a schedule. Your health care  provider will recommend vaccines for you based on your age, medical history, and lifestyle or other factors, such as travel or where you work. What tests do I need? Screening Your health care provider may recommend screening tests for certain conditions. This may include: Lipid and cholesterol levels. Diabetes screening. This is done by checking your blood sugar (glucose) after you have not eaten for a while (fasting). Hepatitis B test. Hepatitis C test. HIV (human immunodeficiency virus) test. STI (sexually transmitted infection) testing, if you are at risk. Lung cancer screening. Prostate cancer screening. Colorectal cancer screening. Talk with your health care provider about your test results, treatment options, and if necessary, the need for more tests. Follow these instructions at home: Eating and drinking  Eat a diet that includes fresh fruits and vegetables, whole grains, lean  protein, and low-fat dairy products. Take vitamin and mineral supplements as recommended by your health care provider. Do not drink alcohol if your health care provider tells you not to drink. If you drink alcohol: Limit how much you have to 0-2 drinks a day. Know how much alcohol is in your drink. In the [place] one drink equals one 12 oz bottle of beer (355 mL), one 5 oz glass of wine (148 mL), or one 1 oz glass of hard liquor (44 mL). Lifestyle Brush your teeth every morning and night with fluoride toothpaste. Floss one time each day. Exercise for at least 30 minutes 5 or more days each week. Do not use any products that contain nicotine or tobacco. These products include cigarettes, chewing tobacco, and vaping devices, such as e-cigarettes. If you need help quitting, ask your health care provider. Do not use drugs. If you are sexually active, practice safe sex. Use a condom or other form of protection to prevent STIs. Take aspirin only as told by your health care provider. Make sure that you understand how much to take and what form to take. Work with your health care provider to find out whether it is safe and beneficial for you to take aspirin daily. Find healthy ways to manage stress, such as: Meditation, yoga, or listening to music. Journaling. Talking to a trusted person. Spending time with friends and family. Minimize exposure to UV radiation to reduce your risk of skin cancer. Safety Always wear your seat belt while driving or riding in a vehicle. Do not drive: If you have been drinking alcohol. Do not ride with someone who has been drinking. When you are tired or distracted. While texting. If you have been using any mind-altering substances or drugs. Wear a helmet and other protective equipment during sports activities. If you have firearms in your house, make sure you follow all gun safety procedures. What's next? Go to your health care provider once a year for an annual  wellness visit. Ask your health care provider how often you should have your eyes and teeth checked. Stay up to date on all vaccines. This information is not intended to replace advice given to you by your health care provider. Make sure you discuss any questions you have with your health care provider. Document Revised: 05/14/2021 Document Reviewed: 05/14/2021 Elsevier Patient Education  2024 Elsevier Inc.      Signed,   Meredith Staggers, MD [facility], [facility] 07/14/23 8:34 AM

## 2023-07-14 NOTE — Patient Instructions (Addendum)
Continue to watch diet, exercise, and minimizing alcohol can also help with weight management. If any lab concerns I will let you know.  It is very important that you have colonoscopy. I will refer you once again.  I will check PSA, but if it is still elevated, need to follow up with urology. Please call their office.  No med changes today. Take care!  Preventive Care 18-52 Years Old, Male Preventive care refers to lifestyle choices and visits with your health care provider that can promote health and wellness. Preventive care visits are also called wellness exams. What can I expect for my preventive care visit? Counseling During your preventive care visit, your health care provider may ask about your: Medical history, including: Past medical problems. Family medical history. Current health, including: Emotional well-being. Home life and relationship well-being. Sexual activity. Lifestyle, including: Alcohol, nicotine or tobacco, and drug use. Access to firearms. Diet, exercise, and sleep habits. Safety issues such as seatbelt and bike helmet use. Sunscreen use. Work and work Astronomer. Physical exam Your health care provider will check your: Height and weight. These may be used to calculate your BMI (body mass index). BMI is a measurement that tells if you are at a healthy weight. Waist circumference. This measures the distance around your waistline. This measurement also tells if you are at a healthy weight and may help predict your risk of certain diseases, such as type 2 diabetes and high blood pressure. Heart rate and blood pressure. Body temperature. Skin for abnormal spots. What immunizations do I need?  Vaccines are usually given at various ages, according to a schedule. Your health care provider will recommend vaccines for you based on your age, medical history, and lifestyle or other factors, such as travel or where you work. What tests do I need? Screening Your health  care provider may recommend screening tests for certain conditions. This may include: Lipid and cholesterol levels. Diabetes screening. This is done by checking your blood sugar (glucose) after you have not eaten for a while (fasting). Hepatitis B test. Hepatitis C test. HIV (human immunodeficiency virus) test. STI (sexually transmitted infection) testing, if you are at risk. Lung cancer screening. Prostate cancer screening. Colorectal cancer screening. Talk with your health care provider about your test results, treatment options, and if necessary, the need for more tests. Follow these instructions at home: Eating and drinking  Eat a diet that includes fresh fruits and vegetables, whole grains, lean protein, and low-fat dairy products. Take vitamin and mineral supplements as recommended by your health care provider. Do not drink alcohol if your health care provider tells you not to drink. If you drink alcohol: Limit how much you have to 0-2 drinks a day. Know how much alcohol is in your drink. In the [place] one drink equals one 12 oz bottle of beer (355 mL), one 5 oz glass of wine (148 mL), or one 1 oz glass of hard liquor (44 mL). Lifestyle Brush your teeth every morning and night with fluoride toothpaste. Floss one time each day. Exercise for at least 30 minutes 5 or more days each week. Do not use any products that contain nicotine or tobacco. These products include cigarettes, chewing tobacco, and vaping devices, such as e-cigarettes. If you need help quitting, ask your health care provider. Do not use drugs. If you are sexually active, practice safe sex. Use a condom or other form of protection to prevent STIs. Take aspirin only as told by your health care provider. Make  sure that you understand how much to take and what form to take. Work with your health care provider to find out whether it is safe and beneficial for you to take aspirin daily. Find healthy ways to manage stress,  such as: Meditation, yoga, or listening to music. Journaling. Talking to a trusted person. Spending time with friends and family. Minimize exposure to UV radiation to reduce your risk of skin cancer. Safety Always wear your seat belt while driving or riding in a vehicle. Do not drive: If you have been drinking alcohol. Do not ride with someone who has been drinking. When you are tired or distracted. While texting. If you have been using any mind-altering substances or drugs. Wear a helmet and other protective equipment during sports activities. If you have firearms in your house, make sure you follow all gun safety procedures. What's next? Go to your health care provider once a year for an annual wellness visit. Ask your health care provider how often you should have your eyes and teeth checked. Stay up to date on all vaccines. This information is not intended to replace advice given to you by your health care provider. Make sure you discuss any questions you have with your health care provider. Document Revised: 05/14/2021 Document Reviewed: 05/14/2021 Elsevier Patient Education  2024 [employer].

## 2023-07-16 ENCOUNTER — Telehealth: Payer: Self-pay

## 2023-07-16 NOTE — Telephone Encounter (Signed)
-----   Message from Shade Flood sent at 07/16/2023  1:36 PM EDT ----- Call patient.  Blood counts, electrolytes, liver, kidney tests looked okay.  Cholesterol slightly elevated but improved from previous readings.  Prostate test was elevated, slightly higher than last year.  He has seen urology previously with planned follow-up.  Please call him and let him know to contact urology for follow-up appointment for elevated PSA as we discussed last visit.  Let me know if there are questions.

## 2023-07-19 ENCOUNTER — Encounter: Payer: Self-pay | Admitting: Family Medicine

## 2023-07-29 ENCOUNTER — Other Ambulatory Visit: Payer: Self-pay | Admitting: Family Medicine

## 2023-08-01 DIAGNOSIS — R059 Cough, unspecified: Secondary | ICD-10-CM | POA: Insufficient documentation

## 2023-08-03 ENCOUNTER — Telehealth: Payer: Self-pay | Admitting: Family Medicine

## 2023-08-03 ENCOUNTER — Other Ambulatory Visit: Payer: Self-pay | Admitting: Family Medicine

## 2023-08-03 ENCOUNTER — Telehealth: Payer: Commercial Managed Care - PPO | Admitting: Physician Assistant

## 2023-08-03 DIAGNOSIS — J069 Acute upper respiratory infection, unspecified: Secondary | ICD-10-CM

## 2023-08-03 MED ORDER — BENZONATATE 100 MG PO CAPS
100.0000 mg | ORAL_CAPSULE | Freq: Three times a day (TID) | ORAL | 0 refills | Status: AC | PRN
Start: 2023-08-03 — End: ?

## 2023-08-03 MED ORDER — FLUTICASONE PROPIONATE 50 MCG/ACT NA SUSP
2.0000 | Freq: Every day | NASAL | 0 refills | Status: AC
Start: 2023-08-03 — End: ?

## 2023-08-03 NOTE — Progress Notes (Signed)

## 2023-08-03 NOTE — Telephone Encounter (Signed)
Received forms from [facility] Printed & placed in provider bin

## 2023-08-03 NOTE — Progress Notes (Signed)
I have spent 5 minutes in review of e-visit questionnaire, review and updating patient chart, medical decision making and response to patient.   William Cody Martin, PA-C    

## 2023-08-04 MED ORDER — ALBUTEROL SULFATE HFA 108 (90 BASE) MCG/ACT IN AERS: 1.0000 | INHALATION_SPRAY | Freq: Four times a day (QID) | RESPIRATORY_TRACT | 0 refills | Status: AC | PRN

## 2023-08-04 NOTE — Telephone Encounter (Signed)
Placed in folder at [employer].

## 2023-08-05 NOTE — Telephone Encounter (Signed)
Noted - Paperwork completed and placed in fax bin at back nurse station

## 2023-08-06 NOTE — Telephone Encounter (Signed)
Completed office information section and have faxed to the requested number on the form

## 2023-08-24 ENCOUNTER — Encounter: Payer: Self-pay | Admitting: Gastroenterology

## 2023-08-30 ENCOUNTER — Other Ambulatory Visit: Payer: Self-pay | Admitting: Family Medicine

## 2023-08-30 DIAGNOSIS — F32A Depression, unspecified: Secondary | ICD-10-CM

## 2023-09-02 ENCOUNTER — Ambulatory Visit: Payer: Commercial Managed Care - PPO | Admitting: *Deleted

## 2023-09-02 VITALS — Ht 74.75 in | Wt 270.0 lb

## 2023-09-02 DIAGNOSIS — Z1211 Encounter for screening for malignant neoplasm of colon: Secondary | ICD-10-CM

## 2023-09-02 MED ORDER — NA SULFATE-K SULFATE-MG SULF 17.5-3.13-1.6 GM/177ML PO SOLN
1.0000 | Freq: Once | ORAL | 0 refills | Status: AC
Start: 2023-09-02 — End: 2023-09-02

## 2023-09-02 NOTE — Progress Notes (Signed)
Pt's name and DOB verified at the beginning of the pre-visit wit 2 identifiers  Pt denies any difficulty with ambulating,sitting, laying down or rolling side to side  Gave both [facility] main # and MD on call # prior to instructions.   No egg or soy allergy known to patient   Patient denies ever being intubated  Pt has no issues moving head neck or swallowing  No FH of Malignant Hyperthermia  Pt is not on diet pills or shots  Pt is not on home 02   Pt is not on blood thinners   Pt denies issues with constipation   Pt is not on dialysis  Pt denise any abnormal heart rhythms   Pt denies any upcoming cardiac testing  Pt encouraged to use to use Singlecare or Goodrx to reduce cost   Patient's chart reviewed by Cathlyn Parsons CNRA prior to pre-visit and patient appropriate for the [facility].  Pre-visit completed and red dot placed by patient's name on their procedure day (on provider's schedule).  .  Visit by phone  Pt states weight is 270 lb  Instructed pt why it is important to and  to call if they have any changes in health or new medications. Directed them to the # given and on instructions.     Instructions reviewed with pt and pt states understanding. Instructed to review again prior to procedure. Pt states they will.   Instructions sent by mail with coupon and by my chart Instructed pt not to smoke Marijuana day before or day of procedure. Pt states he won't

## 2023-09-03 ENCOUNTER — Other Ambulatory Visit: Payer: Self-pay | Admitting: Family Medicine

## 2023-09-03 ENCOUNTER — Encounter: Payer: Self-pay | Admitting: Gastroenterology

## 2023-09-03 DIAGNOSIS — Z1212 Encounter for screening for malignant neoplasm of rectum: Secondary | ICD-10-CM

## 2023-09-03 DIAGNOSIS — Z1211 Encounter for screening for malignant neoplasm of colon: Secondary | ICD-10-CM

## 2023-09-07 DIAGNOSIS — R972 Elevated prostate specific antigen [PSA]: Secondary | ICD-10-CM | POA: Insufficient documentation

## 2023-09-16 ENCOUNTER — Ambulatory Visit: Payer: Commercial Managed Care - PPO | Admitting: Gastroenterology

## 2023-09-16 ENCOUNTER — Encounter: Payer: Self-pay | Admitting: Gastroenterology

## 2023-09-16 VITALS — BP 121/81 | HR 70 | Temp 97.8°F | Resp 16 | Ht 74.75 in | Wt 270.0 lb

## 2023-09-16 DIAGNOSIS — Z1211 Encounter for screening for malignant neoplasm of colon: Secondary | ICD-10-CM | POA: Diagnosis present

## 2023-09-16 DIAGNOSIS — D214 Benign neoplasm of connective and other soft tissue of abdomen: Secondary | ICD-10-CM | POA: Diagnosis not present

## 2023-09-16 DIAGNOSIS — D125 Benign neoplasm of sigmoid colon: Secondary | ICD-10-CM

## 2023-09-16 MED ORDER — SODIUM CHLORIDE 0.9 % IV SOLN: 500.0000 mL | Freq: Once | INTRAVENOUS | Status: DC

## 2023-09-16 NOTE — Progress Notes (Signed)
Called to room to assist during endoscopic procedure.  Patient ID and intended procedure confirmed with present staff. Received instructions for my participation in the procedure from the performing physician.  

## 2023-09-16 NOTE — Progress Notes (Signed)
History and Physical:  This patient presents for endoscopic testing for: Encounter Diagnosis  Name Primary?   Special screening for malignant neoplasms, colon Yes    Average risk for colorectal cancer.  First screening exam.  Patient denies chronic abdominal pain, rectal bleeding, constipation or diarrhea.   Patient is otherwise without complaints or active issues today.   Past Medical History: Past Medical History:  Diagnosis Date   Depression    Hypertension      Past Surgical History: History reviewed. No pertinent surgical history.  Allergies: No Known Allergies  Outpatient Meds: Current Outpatient Medications  Medication Sig Dispense Refill   amLODipine (NORVASC) 5 MG tablet Take 1 tablet (5 mg total) by mouth daily. 90 tablet 3   buPROPion (WELLBUTRIN) 100 MG tablet Take 1 tablet (100 mg total) by mouth daily. 90 tablet 3   fluticasone (FLONASE) 50 MCG/ACT nasal spray Place 2 sprays into both nostrils daily. 16 g 0   hydrochlorothiazide (HYDRODIURIL) 25 MG tablet Take 1 tablet (25 mg total) by mouth daily. 90 tablet 3   losartan (COZAAR) 50 MG tablet Take 1 tablet (50 mg total) by mouth daily. 90 tablet 3   Multiple Vitamin (MULTIVITAMIN WITH MINERALS) TABS tablet Take 1 tablet by mouth daily.     albuterol (VENTOLIN HFA) 108 (90 Base) MCG/ACT inhaler Inhale 1-2 puffs into the lungs every 6 (six) hours as needed for wheezing or shortness of breath. 9 g 0   clotrimazole-betamethasone (LOTRISONE) cream Apply topically daily. (Patient not taking: Reported on 07/14/2023)     tadalafil (CIALIS) 20 MG tablet TAKE 1/2 TO 1 TABLET BY MOUTH EVERY OTHER DAY AS NEEDED FOR ERECTILE DYSRUNCTION 10 tablet 11   Current Facility-Administered Medications  Medication Dose Route Frequency Provider Last Rate Last Admin   0.9 %  sodium chloride infusion  500 mL Intravenous Once Danis, Andreas Blower, MD           ___________________________________________________________________ Objective   Exam:  BP 134/77   Pulse 75   Temp 97.8 F (36.6 C) (Skin)   Ht 6' 2.75" (1.899 m)   Wt 270 lb (122.5 kg)   SpO2 98%   BMI 33.97 kg/m   CV: regular , S1/S2 Resp: clear to auscultation bilaterally, normal RR and effort noted GI: soft, no tenderness, with active bowel sounds.   Assessment: Encounter Diagnosis  Name Primary?   Special screening for malignant neoplasms, colon Yes     Plan: Colonoscopy   The benefits and risks of the planned procedure were described in detail with the patient or (when appropriate) their health care proxy.  Risks were outlined as including, but not limited to, bleeding, infection, perforation, adverse medication reaction leading to cardiac or pulmonary decompensation, pancreatitis (if ERCP).  The limitation of incomplete mucosal visualization was also discussed.  No guarantees or warranties were given.  The patient is appropriate for an endoscopic procedure in the ambulatory setting.   - Amada Jupiter, MD

## 2023-09-16 NOTE — Progress Notes (Signed)
Uneventful anesthetic. Report to pacu rn. Vss. Care resumed by rn. 

## 2023-09-16 NOTE — Patient Instructions (Signed)

## 2023-09-16 NOTE — Op Note (Signed)
[facility] Patient Name: Cameron Chase Procedure Date: 09/16/2023 8:55 AM MRN: [id number] Endoscopist: Sherilyn Cooter L. Myrtie Neither , MD, [id number] Age: 52 Referring MD:  Date of Birth: 07/25/1971 Gender: Male Account #: [id number] Procedure:                Colonoscopy Indications:              Screening for colorectal malignant neoplasm, This                            is the patient's first colonoscopy Medicines:                Monitored Anesthesia Care Procedure:                Pre-Anesthesia Assessment:                           - Prior to the procedure, a History and Physical                            was performed, and patient medications and                            allergies were reviewed. The patient's tolerance of                            previous anesthesia was also reviewed. The risks                            and benefits of the procedure and the sedation                            options and risks were discussed with the patient.                            All questions were answered, and informed consent                            was obtained. Prior Anticoagulants: The patient has                            taken no anticoagulant or antiplatelet agents. ASA                            Grade Assessment: II - A patient with mild systemic                            disease. After reviewing the risks and benefits,                            the patient was deemed in satisfactory condition to                            undergo the procedure.  After obtaining informed consent, the colonoscope                            was passed under direct vision. Throughout the                            procedure, the patient's blood pressure, pulse, and                            oxygen saturations were monitored continuously. The                            Olympus Scope [id number] was introduced through the                            anus and advanced to  the the cecum, identified by                            appendiceal orifice and ileocecal valve. The                            colonoscopy was performed without difficulty. The                            patient tolerated the procedure well. The quality                            of the bowel preparation was good. The ileocecal                            valve, appendiceal orifice, and rectum were                            photographed. Scope In: 9:08:37 AM Scope Out: 9:21:43 AM Scope Withdrawal Time: 0 hours 10 minutes 58 seconds  Total Procedure Duration: 0 hours 13 minutes 6 seconds  Findings:                 The perianal and digital rectal examinations were                            normal.                           Repeat examination of right colon under NBI                            performed.                           Diverticula were found in the left colon.                           A diminutive polyp was found in the sigmoid colon.  The polyp was sessile. The polyp was removed with a                            cold snare. Resection and retrieval were complete.                           Internal hemorrhoids were found.                           The exam was otherwise without abnormality on                            direct and retroflexion views. Complications:            No immediate complications. Estimated Blood Loss:     Estimated blood loss was minimal. Impression:               - Diverticulosis in the left colon.                           - One diminutive polyp in the sigmoid colon,                            removed with a cold snare. Resected and retrieved.                           - Internal hemorrhoids.                           - The examination was otherwise normal on direct                            and retroflexion views. Recommendation:           - Patient has a contact number available for                            emergencies.  The signs and symptoms of potential                            delayed complications were discussed with the                            patient. Return to normal activities tomorrow.                            Written discharge instructions were provided to the                            patient.                           - Resume previous diet.                           - Continue present medications.                           -  Await pathology results.                           - Repeat colonoscopy is recommended for                            surveillance. The colonoscopy date will be                            determined after pathology results from today's                            exam become available for review. Labrea Eccleston L. Myrtie Neither, MD 09/16/2023 9:26:57 AM This report has been signed electronically.

## 2023-09-16 NOTE — Progress Notes (Signed)
Pt's states no medical or surgical changes since previsit or office visit. 

## 2023-09-17 ENCOUNTER — Telehealth: Payer: Self-pay

## 2023-09-17 NOTE — Telephone Encounter (Signed)
  Follow up Call-     09/16/2023    8:46 AM  Call back number  Post procedure Call Back phone  # [phone]  Permission to leave phone message Yes     Patient questions:  Do you have a fever, pain , or abdominal swelling? No. Pain Score  0 *  Have you tolerated food without any problems? Yes.    Have you been able to return to your normal activities? Yes.    Do you have any questions about your discharge instructions: Diet   No. Medications  No. Follow up visit  No.  Do you have questions or concerns about your Care? No.  Actions: * If pain score is 4 or above: No action needed, pain <4.

## 2023-09-20 LAB — SURGICAL PATHOLOGY

## 2023-09-22 ENCOUNTER — Encounter: Payer: Self-pay | Admitting: Gastroenterology

## 2023-12-29 ENCOUNTER — Other Ambulatory Visit (INDEPENDENT_AMBULATORY_CARE_PROVIDER_SITE_OTHER): Payer: Commercial Managed Care - PPO

## 2023-12-29 ENCOUNTER — Encounter: Payer: Self-pay | Admitting: Family Medicine

## 2023-12-29 ENCOUNTER — Telehealth (INDEPENDENT_AMBULATORY_CARE_PROVIDER_SITE_OTHER): Payer: Commercial Managed Care - PPO | Admitting: Family Medicine

## 2023-12-29 DIAGNOSIS — R1084 Generalized abdominal pain: Secondary | ICD-10-CM

## 2023-12-29 DIAGNOSIS — R1110 Vomiting, unspecified: Secondary | ICD-10-CM

## 2023-12-29 LAB — COMPREHENSIVE METABOLIC PANEL
ALT: 20 U/L (ref 0–53)
AST: 21 U/L (ref 0–37)
Albumin: 4.6 g/dL (ref 3.5–5.2)
Alkaline Phosphatase: 63 U/L (ref 39–117)
BUN: 13 mg/dL (ref 6–23)
CO2: 31 meq/L (ref 19–32)
Calcium: 9.3 mg/dL (ref 8.4–10.5)
Chloride: 102 meq/L (ref 96–112)
Creatinine, Ser: 1.07 mg/dL (ref 0.40–1.50)
GFR: 79.68 mL/min (ref >60.00)
Glucose, Bld: 91 mg/dL (ref 70–99)
Potassium: 3.8 meq/L (ref 3.5–5.1)
Sodium: 139 meq/L (ref 135–145)
Total Bilirubin: 0.8 mg/dL (ref 0.2–1.2)
Total Protein: 6.8 g/dL (ref 6.0–8.3)

## 2023-12-29 LAB — CBC WITH DIFFERENTIAL/PLATELET
Basophils Absolute: 0.1 10*3/uL (ref 0.0–0.1)
Basophils Relative: 1.4 % (ref 0.0–3.0)
Eosinophils Absolute: 0.2 10*3/uL (ref 0.0–0.7)
Eosinophils Relative: 4.6 % (ref 0.0–5.0)
HCT: 44.7 % (ref 39.0–52.0)
Hemoglobin: 15.1 g/dL (ref 13.0–17.0)
Lymphocytes Relative: 24.8 % (ref 12.0–46.0)
Lymphs Abs: 1.1 10*3/uL (ref 0.7–4.0)
MCHC: 33.7 g/dL (ref 30.0–36.0)
MCV: 87.4 fL (ref 78.0–100.0)
Monocytes Absolute: 0.5 10*3/uL (ref 0.1–1.0)
Monocytes Relative: 11.7 % (ref 3.0–12.0)
Neutro Abs: 2.7 10*3/uL (ref 1.4–7.7)
Neutrophils Relative %: 57.5 % (ref 43.0–77.0)
Platelets: 371 10*3/uL (ref 150.0–400.0)
RBC: 5.12 Mil/uL (ref 4.22–5.81)
RDW: 13 % (ref 11.5–15.5)
WBC: 4.6 10*3/uL (ref 4.0–10.5)

## 2023-12-29 LAB — LIPASE: Lipase: 35 U/L (ref 11.0–59.0)

## 2023-12-29 NOTE — Patient Instructions (Addendum)
Blood work today.  [facility] in 8:30-4:30 during weekdays, no appointment needed [address]  [place], [place] [address]  Nexium once per day and exam in office at follow up visit tomorrow afternoon.  If any worsening overnight proceed to ER.  Hang in there.   Abdominal Pain, Adult  Pain in the abdomen (abdominal pain) can be caused by many things. In most cases, it gets better with no treatment or by being treated at home. But in some cases, it can be serious. Your health care provider will ask questions about your medical history and do a physical exam to try to figure out what is causing your pain. Follow these instructions at home: Medicines Take over-the-counter and prescription medicines only as told by your provider. Do not take medicines that help you poop (laxatives) unless told by your provider. General instructions Watch your condition for any changes. Drink enough fluid to keep your pee (urine) pale yellow. Contact a health care provider if: Your pain changes, gets worse, or lasts longer than expected. You have severe cramping or bloating in your abdomen, or you vomit. Your pain gets worse with meals, after eating, or with certain foods. You are constipated or have diarrhea for more than 2-3 days. You are not hungry, or you lose weight without trying. You have signs of dehydration. These may include: Dark pee, very little pee, or no pee. Cracked lips or dry mouth. Sleepiness or weakness. You have pain when you pee (urinate) or poop. Your abdominal pain wakes you up at night. You have blood in your pee. You have a fever. Get help right away if: You cannot stop vomiting. Your pain is only in one part of the abdomen. Pain on the right side could be caused by appendicitis. You have bloody or black poop (stool), or poop that looks like tar. You have trouble breathing. You have chest pain. These symptoms may be an emergency. Get help right away. Call 911. Do  not wait to see if the symptoms will go away. Do not drive yourself to the hospital. This information is not intended to replace advice given to you by your health care provider. Make sure you discuss any questions you have with your health care provider. Document Revised: 09/02/2022 Document Reviewed: 09/02/2022 Elsevier Patient Education  2024 [employer].

## 2023-12-29 NOTE — Progress Notes (Signed)
Virtual Visit via Video Note Not on video at this time - called patient. He logged off but plans to reconnect.  I connected with Cameron Chase on 12/29/23 at 10:31 AM by a video enabled telemedicine application and verified that I am speaking with the correct person using two identifiers.  Patient location: at home, by self.  My location: office - [place].    I discussed the limitations, risks, security and privacy concerns of performing an evaluation and management service by telephone and the availability of in person appointments. I also discussed with the patient that there may be a patient responsible charge related to this service. The patient expressed understanding and agreed to proceed, consent obtained  Chief complaint:  Chief Complaint  Patient presents with   Abdominal Pain    Pt notes abdominal pain starting Tuesday last week, was just cramping/ nausea, today and yesterday was nauseous and vomiting, notes feeling better today than last several days, notes no new supplements medications or foods, notes he did travel to [facility] the weekend prior to this occurring but felt fine until Tuesday, notes no diarrhea or constipation     History of Present Illness: Cameron Chase is a 53 y.o. male  Abdominal pain: Started 8 days ago - went to [place] week before. No alcohol since. Some increased alcohol that weekend Felt ok, but then 2 days later had abd cramping, nausea, but no vomiting. Loose stool, no diarrhea. No fever.  No other sick contacts.  Some decreased appetite. Still able to work. Felt better last weekend into Monday. Woke up yesterday morning feeling worse - similar abd pain, cramps, sour stomach. Left work early as feeling worse, Vomiting yesterday at noon 3 times. No hematemesis. Loose stool this morning.  No fever.  Ate crackers.  Drinking fluids.  Stomach still doesn't feel well., but better today.  Wife with similar sx's this morning.  No jaundice/scleral  icterus.  Tx: nexium few days ago. Tums last week.   Patient Active Problem List   Diagnosis Date Noted   COVID-19 11/11/2021   Hyperlipidemia 02/07/2016   Erectile dysfunction 07/29/2012   Benign hypertension 07/18/2012   Depression 07/18/2012   Past Medical History:  Diagnosis Date   Depression    Hypertension    No past surgical history on file. No Known Allergies Prior to Admission medications   Medication Sig Start Date End Date Taking? Authorizing Provider  albuterol (VENTOLIN HFA) 108 (90 Base) MCG/ACT inhaler Inhale 1-2 puffs into the lungs every 6 (six) hours as needed for wheezing or shortness of breath. 08/04/23  Yes Shade Flood, MD  amLODipine (NORVASC) 5 MG tablet Take 1 tablet (5 mg total) by mouth daily. 07/14/23  Yes Shade Flood, MD  buPROPion (WELLBUTRIN) 100 MG tablet Take 1 tablet (100 mg total) by mouth daily. 07/14/23  Yes Shade Flood, MD  fluticasone [facility]) 50 MCG/ACT nasal spray Place 2 sprays into both nostrils daily. 08/03/23  Yes Waldon Merl, PA-C  hydrochlorothiazide (HYDRODIURIL) 25 MG tablet Take 1 tablet (25 mg total) by mouth daily. 07/14/23  Yes Shade Flood, MD  losartan (COZAAR) 50 MG tablet Take 1 tablet (50 mg total) by mouth daily. 07/14/23  Yes Shade Flood, MD  Multiple Vitamin (MULTIVITAMIN WITH MINERALS) TABS tablet Take 1 tablet by mouth daily.   Yes [provider]  tadalafil (CIALIS) 20 MG tablet TAKE 1/2 TO 1 TABLET BY MOUTH EVERY OTHER DAY AS NEEDED FOR ERECTILE  DYSRUNCTION 07/14/23  Yes Shade Flood, MD  clotrimazole-betamethasone (LOTRISONE) cream Apply topically daily. Patient not taking: Reported on 12/29/2023 05/29/22   [provider]   Social History   Socioeconomic History   Marital status: Married    Spouse name: Not on file   Number of children: Not on file   Years of education: Not on file   Highest education level: Not on file  Occupational History   Not on file   Tobacco Use   Smoking status: Never   Smokeless tobacco: Never   Tobacco comments:    Pt uses nicotine gum   Vaping Use   Vaping status: Never Used  Substance and Sexual Activity   Alcohol use: Not Currently    Alcohol/week: 12.0 standard drinks of alcohol    Types: 12 Standard drinks or equivalent per week    Comment: beer and wine apx 12 drinks a week   Drug use: Yes    Types: Marijuana    Comment: once a month or less   Sexual activity: Yes  Other Topics Concern   Not on file  Social History Narrative   Married to second wife in August 2024   Social Drivers of Corporate investment banker Strain: Not on file  Food Insecurity: Not on file  Transportation Needs: Not on file  Physical Activity: Not on file  Stress: Not on file  Social Connections: Not on file  Intimate Partner Violence: Not on file    Observations/Objective: There were no vitals filed for this visit. Nontoxic appearance on video. Speaking in full sentences, no respiratory distress  Assessment and Plan: Abdominal pain, generalized - Plan: CBC with Differential/Platelet, Comprehensive metabolic panel, Lipase  Vomiting, unspecified vomiting type, unspecified whether nausea present - Plan: CBC with Differential/Platelet, Comprehensive metabolic panel, Lipase Relapsing remitting abd pain past week with vomiting yesterday. Minimally improved today. Ddx of food borne illness, viral illness, gastritis. less likely appendicitis, pancreatitis or bacterial infection.   -Limited as unable to perform exam over video but with improvement today, check labs and in office exam tomorrow with overnight ER precautions. Continue nexium every day for now, avoid alcohol. Stay hydrated, bland foods only if tolerated.   Follow Up Instructions:    I discussed the assessment and treatment plan with the patient. The patient was provided an opportunity to ask questions and all were answered. The patient agreed with the plan and  demonstrated an understanding of the instructions.   The patient was advised to call back or seek an in-person evaluation if the symptoms worsen or if the condition fails to improve as anticipated.   Shade Flood, MD

## 2023-12-30 ENCOUNTER — Ambulatory Visit: Payer: Commercial Managed Care - PPO | Admitting: Family Medicine

## 2023-12-30 ENCOUNTER — Encounter: Payer: Self-pay | Admitting: Family Medicine

## 2023-12-30 VITALS — BP 116/60 | HR 65 | Temp 98.0°F | Ht 74.75 in | Wt 269.2 lb

## 2023-12-30 DIAGNOSIS — R1110 Vomiting, unspecified: Secondary | ICD-10-CM

## 2023-12-30 DIAGNOSIS — R1084 Generalized abdominal pain: Secondary | ICD-10-CM

## 2023-12-30 NOTE — Progress Notes (Signed)
Subjective:  Patient ID: Cameron Chase, male    DOB: October 20, 1971  Age: 53 y.o. MRN: [id number]  CC:  Chief Complaint  Patient presents with   Abdominal Pain    Pt notes started about 9 days ago, notes nausea, and cramping, notes went to [place] the weekend prior to feeling unwell.      HPI Cameron Chase presents for   Abdominal pain Follow-up from virtual visit yesterday.  See details from that visit.  CBC, CMP, lipase were normal yesterday.  Slight improvement yesterday, possible foodborne illness, viral illness, gastroenteritis.  Still slight sour stomach.  Better as day went on yesterday and today. Still slight dec appetite. Still taking nexium.  Wife with similar sx's yesterday - she is better today.   He is drinking fluids, did keep oatmeal down this morning as well as some chips.     History Patient Active Problem List   Diagnosis Date Noted   COVID-19 11/11/2021   Hyperlipidemia 02/07/2016   Erectile dysfunction 07/29/2012   Benign hypertension 07/18/2012   Depression 07/18/2012   Past Medical History:  Diagnosis Date   Depression    Hypertension    No past surgical history on file. No Known Allergies Prior to Admission medications   Medication Sig Start Date End Date Taking? Authorizing Provider  albuterol (VENTOLIN HFA) 108 (90 Base) MCG/ACT inhaler Inhale 1-2 puffs into the lungs every 6 (six) hours as needed for wheezing or shortness of breath. 08/04/23  Yes Shade Flood, MD  amLODipine (NORVASC) 5 MG tablet Take 1 tablet (5 mg total) by mouth daily. 07/14/23  Yes Shade Flood, MD  buPROPion (WELLBUTRIN) 100 MG tablet Take 1 tablet (100 mg total) by mouth daily. 07/14/23  Yes Shade Flood, MD  fluticasone [facility]) 50 MCG/ACT nasal spray Place 2 sprays into both nostrils daily. 08/03/23  Yes Waldon Merl, PA-C  hydrochlorothiazide (HYDRODIURIL) 25 MG tablet Take 1 tablet (25 mg total) by mouth daily. 07/14/23  Yes Shade Flood, MD  losartan  (COZAAR) 50 MG tablet Take 1 tablet (50 mg total) by mouth daily. 07/14/23  Yes Shade Flood, MD  Multiple Vitamin (MULTIVITAMIN WITH MINERALS) TABS tablet Take 1 tablet by mouth daily.   Yes [provider]  tadalafil (CIALIS) 20 MG tablet TAKE 1/2 TO 1 TABLET BY MOUTH EVERY OTHER DAY AS NEEDED FOR ERECTILE DYSRUNCTION 07/14/23  Yes Shade Flood, MD  clotrimazole-betamethasone (LOTRISONE) cream Apply topically daily. Patient not taking: Reported on 07/14/2023 05/29/22   [provider]   Social History   Socioeconomic History   Marital status: Married    Spouse name: Not on file   Number of children: Not on file   Years of education: Not on file   Highest education level: Not on file  Occupational History   Not on file  Tobacco Use   Smoking status: Never   Smokeless tobacco: Never   Tobacco comments:    Pt uses nicotine gum   Vaping Use   Vaping status: Never Used  Substance and Sexual Activity   Alcohol use: Not Currently    Alcohol/week: 12.0 standard drinks of alcohol    Types: 12 Standard drinks or equivalent per week    Comment: beer and wine apx 12 drinks a week   Drug use: Yes    Types: Marijuana    Comment: once a month or less   Sexual activity: Yes  Other Topics Concern   Not on  file  Social History Narrative   Married to second wife in August 2024   Social Drivers of Corporate investment banker Strain: Not on file  Food Insecurity: Not on file  Transportation Needs: Not on file  Physical Activity: Not on file  Stress: Not on file  Social Connections: Not on file  Intimate Partner Violence: Not on file    Review of Systems   Objective:   Vitals:   12/30/23 1635  BP: 116/60  Pulse: 65  Temp: 98 F (36.7 C)  TempSrc: Temporal  SpO2: 96%  Weight: 269 lb 3.2 oz (122.1 kg)  Height: 6' 2.75" (1.899 m)     Physical Exam Vitals reviewed.  Constitutional:      Appearance: He is well-developed.  HENT:     Head:  Normocephalic and atraumatic.  Neck:     Vascular: No carotid bruit or JVD.  Cardiovascular:     Rate and Rhythm: Normal rate and regular rhythm.     Heart sounds: Normal heart sounds. No murmur heard. Pulmonary:     Effort: Pulmonary effort is normal.     Breath sounds: Normal breath sounds. No rales.  Abdominal:     General: There is no distension.     Tenderness: There is abdominal tenderness (Diffuse with deep palpation, no rebound/guarding.).     Comments: Normal bowel sounds.  Musculoskeletal:     Right lower leg: No edema.     Left lower leg: No edema.  Skin:    General: Skin is warm and dry.  Neurological:     Mental Status: He is alert and oriented to person, place, and time.  Psychiatric:        Mood and Affect: Mood normal.    Results for orders placed or performed in visit on 12/29/23  Lipase   Collection Time: 12/29/23 12:15 PM  Result Value Ref Range   Lipase 35.0 11.0 - 59.0 U/L  Comprehensive metabolic panel   Collection Time: 12/29/23 12:15 PM  Result Value Ref Range   Sodium 139 135 - 145 mEq/L   Potassium 3.8 3.5 - 5.1 mEq/L   Chloride 102 96 - 112 mEq/L   CO2 31 19 - 32 mEq/L   Glucose, Bld 91 70 - 99 mg/dL   BUN 13 6 - 23 mg/dL   Creatinine, Ser 5.18 0.40 - 1.50 mg/dL   Total Bilirubin 0.8 0.2 - 1.2 mg/dL   Alkaline Phosphatase 63 39 - 117 U/L   AST 21 0 - 37 U/L   ALT 20 0 - 53 U/L   Total Protein 6.8 6.0 - 8.3 g/dL   Albumin 4.6 3.5 - 5.2 g/dL   GFR 84.16 >60.63 mL/min   Calcium 9.3 8.4 - 10.5 mg/dL  CBC with Differential/Platelet   Collection Time: 12/29/23 12:15 PM  Result Value Ref Range   WBC 4.6 4.0 - 10.5 K/uL   RBC 5.12 4.22 - 5.81 Mil/uL   Hemoglobin 15.1 13.0 - 17.0 g/dL   HCT 01.6 01.0 - 93.2 %   MCV 87.4 78.0 - 100.0 fl   MCHC 33.7 30.0 - 36.0 g/dL   RDW 35.5 73.2 - 20.2 %   Platelets 371.0 150.0 - 400.0 K/uL   Neutrophils Relative % 57.5 43.0 - 77.0 %   Lymphocytes Relative 24.8 12.0 - 46.0 %   Monocytes Relative 11.7 3.0  - 12.0 %   Eosinophils Relative 4.6 0.0 - 5.0 %   Basophils Relative 1.4 0.0 - 3.0 %  Neutro Abs 2.7 1.4 - 7.7 K/uL   Lymphs Abs 1.1 0.7 - 4.0 K/uL   Monocytes Absolute 0.5 0.1 - 1.0 K/uL   Eosinophils Absolute 0.2 0.0 - 0.7 K/uL   Basophils Absolute 0.1 0.0 - 0.1 K/uL     Assessment & Plan:  ROHN FRITSCH is a 53 y.o. male . Abdominal pain, generalized  Vomiting, unspecified vomiting type, unspecified whether nausea present Suspected viral gastroenteritis, possible norovirus or other viral illness but has improved and reassuring labs yesterday.  Tolerating p.o. fluids and some solids today.  Continue symptomatic care with RTC precautions.  No orders of the defined types were placed in this encounter.  Patient Instructions  Thanks for coming in today.  Glad to hear that you are improving.  Labs were normal.  I am suspicious for a possible viral illness as we discussed but that should continue to improve.  Continue to drink fluids and slow resumption of solid foods.  Start with bland foods like mashed potatoes, soup, crackers, and slowly increase. If any worsening symptoms please follow-up but I do not expect that to occur.  Take care!      Signed,   Meredith Staggers, MD [facility], [facility] 12/30/23 5:18 PM

## 2023-12-30 NOTE — Patient Instructions (Addendum)
Thanks for coming in today.  Glad to hear that you are improving.  Labs were normal.  I am suspicious for a possible viral illness as we discussed but that should continue to improve.  Continue to drink fluids and slow resumption of solid foods.  Start with bland foods like mashed potatoes, soup, crackers, and slowly increase. If any worsening symptoms please follow-up but I do not expect that to occur.  Take care!

## 2024-01-24 ENCOUNTER — Encounter: Payer: Self-pay | Admitting: Family Medicine

## 2024-07-17 ENCOUNTER — Other Ambulatory Visit: Payer: Self-pay | Admitting: Family Medicine

## 2024-07-17 ENCOUNTER — Encounter: Admitting: Family Medicine

## 2024-07-17 DIAGNOSIS — I10 Essential (primary) hypertension: Secondary | ICD-10-CM

## 2024-07-19 ENCOUNTER — Other Ambulatory Visit: Payer: Self-pay | Admitting: Family Medicine

## 2024-07-19 ENCOUNTER — Encounter: Payer: Commercial Managed Care - PPO | Admitting: Family Medicine

## 2024-07-19 DIAGNOSIS — N529 Male erectile dysfunction, unspecified: Secondary | ICD-10-CM

## 2024-07-20 ENCOUNTER — Ambulatory Visit (INDEPENDENT_AMBULATORY_CARE_PROVIDER_SITE_OTHER): Admitting: Family Medicine

## 2024-07-20 ENCOUNTER — Encounter: Payer: Self-pay | Admitting: Family Medicine

## 2024-07-20 VITALS — BP 130/88 | HR 80 | Temp 98.2°F | Resp 13 | Ht 74.75 in | Wt 281.0 lb

## 2024-07-20 DIAGNOSIS — Z0000 Encounter for general adult medical examination without abnormal findings: Secondary | ICD-10-CM

## 2024-07-20 DIAGNOSIS — N529 Male erectile dysfunction, unspecified: Secondary | ICD-10-CM

## 2024-07-20 DIAGNOSIS — F32A Depression, unspecified: Secondary | ICD-10-CM

## 2024-07-20 DIAGNOSIS — E66812 Obesity, class 2: Secondary | ICD-10-CM

## 2024-07-20 DIAGNOSIS — I10 Essential (primary) hypertension: Secondary | ICD-10-CM | POA: Diagnosis not present

## 2024-07-20 DIAGNOSIS — E785 Hyperlipidemia, unspecified: Secondary | ICD-10-CM

## 2024-07-20 DIAGNOSIS — Z6835 Body mass index (BMI) 35.0-35.9, adult: Secondary | ICD-10-CM

## 2024-07-20 NOTE — Patient Instructions (Signed)
 Thanks for coming in today.  Lab visit next week.  No medication changes at this time.  I will place a referral to healthy weight and wellness as I think they may be the best bet for you for successful weight loss program and to discuss specific meds that may be best for you.  Let them know of typical triggers for dietary changes, and keep up the good work with trying to cut back on alcoholic beverages as those are also likely impacting your ability to lose weight.  If any concerns on labs I will let you know.  Let me know if there are any questions and take care!  [facility] Medical Weight Loss Management  [phone]  Preventive Care 37-32 Years Old, Male Preventive care refers to lifestyle choices and visits with your health care provider that can promote health and wellness. Preventive care visits are also called wellness exams. What can I expect for my preventive care visit? Counseling During your preventive care visit, your health care provider may ask about your: Medical history, including: Past medical problems. Family medical history. Current health, including: Emotional well-being. Home life and relationship well-being. Sexual activity. Lifestyle, including: Alcohol, nicotine or tobacco, and drug use. Access to firearms. Diet, exercise, and sleep habits. Safety issues such as seatbelt and bike helmet use. Sunscreen use. Work and work Astronomer. Physical exam Your health care provider will check your: Height and weight. These may be used to calculate your BMI (body mass index). BMI is a measurement that tells if you are at a healthy weight. Waist circumference. This measures the distance around your waistline. This measurement also tells if you are at a healthy weight and may help predict your risk of certain diseases, such as type 2 diabetes and high blood pressure. Heart rate and blood pressure. Body temperature. Skin for abnormal spots. What  immunizations do I need?  Vaccines are usually given at various ages, according to a schedule. Your health care provider will recommend vaccines for you based on your age, medical history, and lifestyle or other factors, such as travel or where you work. What tests do I need? Screening Your health care provider may recommend screening tests for certain conditions. This may include: Lipid and cholesterol levels. Diabetes screening. This is done by checking your blood sugar (glucose) after you have not eaten for a while (fasting). Hepatitis B test. Hepatitis C test. HIV (human immunodeficiency virus) test. STI (sexually transmitted infection) testing, if you are at risk. Lung cancer screening. Prostate cancer screening. Colorectal cancer screening. Talk with your health care provider about your test results, treatment options, and if necessary, the need for more tests. Follow these instructions at home: Eating and drinking  Eat a diet that includes fresh fruits and vegetables, whole grains, lean protein, and low-fat dairy products. Take vitamin and mineral supplements as recommended by your health care provider. Do not drink alcohol if your health care provider tells you not to drink. If you drink alcohol: Limit how much you have to 0-2 drinks a day. Know how much alcohol is in your drink. In the [place] one drink equals one 12 oz bottle of beer (355 mL), one 5 oz glass of wine (148 mL), or one 1 oz glass of hard liquor (44 mL). Lifestyle Brush your teeth every morning and night with fluoride toothpaste. Floss one time each day. Exercise for at least 30 minutes 5 or more days each week. Do not use any products that contain nicotine  or tobacco. These products include cigarettes, chewing tobacco, and vaping devices, such as e-cigarettes. If you need help quitting, ask your health care provider. Do not use drugs. If you are sexually active, practice safe sex. Use a condom or other form of  protection to prevent STIs. Take aspirin only as told by your health care provider. Make sure that you understand how much to take and what form to take. Work with your health care provider to find out whether it is safe and beneficial for you to take aspirin daily. Find healthy ways to manage stress, such as: Meditation, yoga, or listening to music. Journaling. Talking to a trusted person. Spending time with friends and family. Minimize exposure to UV radiation to reduce your risk of skin cancer. Safety Always wear your seat belt while driving or riding in a vehicle. Do not drive: If you have been drinking alcohol. Do not ride with someone who has been drinking. When you are tired or distracted. While texting. If you have been using any mind-altering substances or drugs. Wear a helmet and other protective equipment during sports activities. If you have firearms in your house, make sure you follow all gun safety procedures. What's next? Go to your health care provider once a year for an annual wellness visit. Ask your health care provider how often you should have your eyes and teeth checked. Stay up to date on all vaccines. This information is not intended to replace advice given to you by your health care provider. Make sure you discuss any questions you have with your health care provider. Document Revised: 05/14/2021 Document Reviewed: 05/14/2021 Elsevier Patient Education  2024 [employer].

## 2024-07-20 NOTE — Progress Notes (Unsigned)
 Subjective:  Patient ID: Cameron Chase, male    DOB: 1971-05-03  Age: 53 y.o. MRN: [id number]  CC:  Chief Complaint  Patient presents with   Annual Exam    Wants to discuss weight loss. No other concerns    HPI Cameron Chase presents for Annual Exam PCP, me  Oldest graduating from [employer] with masters degree.  Other son is senior at [employer] starting at [employer] this year.  2 step daughters in middle, high school.   Hypertension: Treated with amlodipine  5 mg daily, losartan  50 mg daily, hydrochlorothiazide  25 mg daily.  Denies any medication side effects or home readings.  BP Readings from Last 3 Encounters:  07/20/24 130/88  12/30/23 116/60  09/16/23 121/81   Lab Results  Component Value Date   CREATININE 1.07 12/29/2023   Hyperlipidemia: Diet/exercise approach.  History of obesity as below.  No known heart disease in the family but parents were treated for hyperlipidemia with meds.  Brother with type 1 diabetes. The 10-year ASCVD risk score (Arnett DK, et al., 2019) is: 4.6%   Values used to calculate the score:     Age: 71 years     Clincally relevant sex: Male     Is Non-Hispanic African American: No     Diabetic: No     Tobacco smoker: No     Systolic Blood Pressure: 130 mmHg     Is BP treated: Yes     HDL Cholesterol: 60.6 mg/dL     Total Cholesterol: 198 mg/dL  Lab Results  Component Value Date   CHOL 198 07/14/2023   HDL 60.60 07/14/2023   LDLCALC 116 (H) 07/14/2023   TRIG 105.0 07/14/2023   CHOLHDL 3 07/14/2023   Lab Results  Component Value Date   ALT 20 12/29/2023   AST 21 12/29/2023   ALKPHOS 63 12/29/2023   BILITOT 0.8 12/29/2023   Erectile dysfunction Treated with Cialis  20mg  - 1/2 dose effective.  Denies urinary vision changes, headache or flushing, no chest pain or dyspnea with exertion.  We have discussed potential side effects and risks.   Obesity Weight has increased from 269-281 since January. Previous weight loss  treatments - none.  Fast food - 2 times per week, diet soda only no sweet tea  Has cut back on alcohol, few glasses wine with dinner, beer and wine on weekend. 20 per week (25-30 per year last year). Denies difficulty with cutting back. Some room for improvement with diet during the week.  Current exercise regimen 3-4 times per week with trainer, cardio on another day,  No family history of multiple endocrine neoplasia or medullary thyroid cancer, no personal history of pancreatitis.  Requests separate visit for labs - not fasting.   Body mass index is 35.36 kg/m. Wt Readings from Last 3 Encounters:  07/20/24 281 lb (127.5 kg)  12/30/23 269 lb 3.2 oz (122.1 kg)  09/16/23 270 lb (122.5 kg)   Depression Wellbutrin  100 mg daily, stable.     07/20/2024    3:13 PM 12/29/2023   10:02 AM 07/14/2023    8:10 AM 07/09/2022    1:09 PM 05/12/2021   12:45 PM  Depression screen PHQ 2/9  Decreased Interest 0 0 0 0 0  Down, Depressed, Hopeless 0 0 0 0 0  PHQ - 2 Score 0 0 0 0 0  Altered sleeping 0 0 0 0 0  Tired, decreased energy 0 0 0 0 1  Change in appetite 1 0  0 0 0  Feeling bad or failure about yourself  0 0 0 0 0  Trouble concentrating 0 0 0 0 0  Moving slowly or fidgety/restless 0 0 0 0 0  Suicidal thoughts 0 0 0 0 0  PHQ-9 Score 1 0 0 0 1  Difficult doing work/chores Not difficult at all   Not difficult at all     Health Maintenance  Topic Date Due   Hepatitis B Vaccines 19-59 Average Risk (1 of 3 - 19+ 3-dose series) Never done   Fecal DNA (Cologuard)  Never done   Pneumococcal Vaccine: 50+ Years (1 of 1 - PCV) Never done   INFLUENZA VACCINE  06/30/2024   DTaP/Tdap/Td (3 - Td or Tdap) 11/14/2028   COVID-19 Vaccine  Completed   Hepatitis C Screening  Completed   HIV Screening  Completed   Zoster Vaccines- Shingrix   Completed   HPV VACCINES  Aged Out   Meningococcal B Vaccine  Aged Out   Colonoscopy  Discontinued  Colonoscopy, 09/16/2023, Dr. Legrand, single diminutive polyp, not  precancerous, repeat 10 years. Prostate: History of elevated PSA.  Has been seen by urology.  Followed by [facility].  PSA 4.74 on April 14. Plan for repeat testing - every few months.  Lab Results  Component Value Date   PSA1 4.1 (H) 01/31/2020   PSA 4.96 (H) 07/14/2023   PSA 4.35 (H) 07/09/2022   PSA 3.88 04/30/2021    Immunization History  Administered Date(s) Administered   Fluzone Influenza virus vaccine,trivalent (IIV3), split virus 10/23/2013, 09/12/2014, 09/25/2015   Influenza,inj,Quad PF,6+ Mos 10/08/2015, 11/14/2018, 08/16/2019   Influenza-Unspecified 10/01/2023   Moderna SARS-COV2 Booster Vaccination 09/25/2020   Moderna Sars-Covid-2 Vaccination 02/02/2020, 03/03/2020   Tdap 07/29/2012, 11/14/2018   Unspecified SARS-COV-2 Vaccination 10/01/2023   Zoster Recombinant(Shingrix ) 04/30/2021, 07/09/2022  Covid booster and flu vaccine recommended - will have at work.   No results found. Optho - in past year. No change in rx.   Dental: this morning - every 6 months.   Alcohol:as above  Tobacco: none - quit, cut back on nicotine gum, less need.   Exercise: as above.    History Patient Active Problem List   Diagnosis Date Noted   Elevated PSA, less than 10 ng/ml 09/07/2023   Cough 08/01/2023   Tinea pedis of both feet 06/01/2022   COVID-19 11/11/2021   Hyperlipidemia 02/07/2016   Obesity 10/23/2013   Erectile dysfunction 07/29/2012   Benign hypertension 07/18/2012   Depression 07/18/2012   Past Medical History:  Diagnosis Date   Depression    Hypertension    No past surgical history on file. No Known Allergies Prior to Admission medications   Medication Sig Start Date End Date Taking? Authorizing Provider  albuterol  (VENTOLIN  HFA) 108 (90 Base) MCG/ACT inhaler Inhale 1-2 puffs into the lungs every 6 (six) hours as needed for wheezing or shortness of breath. 08/04/23  Yes Levora Reyes SAUNDERS, MD  amLODipine  (NORVASC ) 5 MG tablet TAKE 1 TABLET (5 MG TOTAL)  BY MOUTH DAILY. 07/17/24  Yes Levora Reyes SAUNDERS, MD  buPROPion  (WELLBUTRIN ) 100 MG tablet Take 1 tablet (100 mg total) by mouth daily. 07/14/23  Yes Levora Reyes SAUNDERS, MD  fluticasone  (FLONASE ) 50 MCG/ACT nasal spray Place 2 sprays into both nostrils daily. 08/03/23  Yes Gladis Elsie BROCKS, PA-C  hydrochlorothiazide  (HYDRODIURIL ) 25 MG tablet TAKE 1 TABLET (25 MG TOTAL) BY MOUTH DAILY. 07/17/24  Yes Levora Reyes SAUNDERS, MD  losartan  (COZAAR ) 50 MG tablet TAKE 1  TABLET BY MOUTH EVERY DAY 07/17/24  Yes Levora Reyes SAUNDERS, MD  Multiple Vitamin (MULTIVITAMIN WITH MINERALS) TABS tablet Take 1 tablet by mouth daily.   Yes [provider]  tadalafil  (CIALIS ) 20 MG tablet TAKE 1/2 TO 1 TABLET BY MOUTH EVERY OTHER DAY AS NEEDED FOR ERECTILE DYSRUNCTION 07/19/24  Yes Levora Reyes SAUNDERS, MD  clotrimazole -betamethasone  (LOTRISONE ) cream Apply topically daily. Patient not taking: Reported on 07/20/2024 05/29/22   [provider]   Social History   Socioeconomic History   Marital status: Married    Spouse name: Not on file   Number of children: Not on file   Years of education: Not on file   Highest education level: Not on file  Occupational History   Not on file  Tobacco Use   Smoking status: Never   Smokeless tobacco: Never   Tobacco comments:    Pt uses nicotine gum   Vaping Use   Vaping status: Never Used  Substance and Sexual Activity   Alcohol use: Not Currently    Alcohol/week: 12.0 standard drinks of alcohol    Types: 12 Standard drinks or equivalent per week    Comment: beer and wine apx 12 drinks a week   Drug use: Yes    Types: Marijuana    Comment: once a month or less   Sexual activity: Yes  Other Topics Concern   Not on file  Social History Narrative   Married to second wife in August 2024   Social Drivers of Corporate investment banker Strain: Not on file  Food Insecurity: Not on file  Transportation Needs: Not on file  Physical Activity: Not on file  Stress: Not  on file  Social Connections: Not on file  Intimate Partner Violence: Not on file    Review of Systems  13 point review of systems per patient health survey noted.  Negative other than as indicated above or in HPI.   Objective:   Vitals:   07/20/24 1507  BP: 130/88  Pulse: 80  Resp: 13  Temp: 98.2 F (36.8 C)  TempSrc: Temporal  SpO2: 96%  Weight: 281 lb (127.5 kg)  Height: 6' 2.75 (1.899 m)     Physical Exam Vitals reviewed.  Constitutional:      Appearance: He is well-developed.  HENT:     Head: Normocephalic and atraumatic.     Right Ear: External ear normal.     Left Ear: External ear normal.  Eyes:     Conjunctiva/sclera: Conjunctivae normal.     Pupils: Pupils are equal, round, and reactive to light.  Neck:     Thyroid: No thyromegaly.  Cardiovascular:     Rate and Rhythm: Normal rate and regular rhythm.     Heart sounds: Normal heart sounds.  Pulmonary:     Effort: Pulmonary effort is normal. No respiratory distress.     Breath sounds: Normal breath sounds. No wheezing.  Abdominal:     General: There is no distension.     Palpations: Abdomen is soft.     Tenderness: There is no abdominal tenderness.  Musculoskeletal:        General: No tenderness. Normal range of motion.     Cervical back: Normal range of motion and neck supple.  Lymphadenopathy:     Cervical: No cervical adenopathy.  Skin:    General: Skin is warm and dry.  Neurological:     Mental Status: He is alert and oriented to person, place, and time.  Deep Tendon Reflexes: Reflexes are normal and symmetric.  Psychiatric:        Behavior: Behavior normal.        Assessment & Plan:  Cameron Chase is a 53 y.o. male . Annual physical exam  - -anticipatory guidance as below in AVS, screening labs above. Health maintenance items as above in HPI discussed/recommended as applicable.   Class 2 obesity without serious comorbidity with body mass index (BMI) of 35.0 to 35.9 in adult,  unspecified obesity type - Plan: Amb Ref to Medical Weight Management, Hemoglobin A1c, Lipid panel, CBC, TSH  - Check screening labs.  Dietary advice discussed including possible impact on weight with alcohol beverages.  Suspect he will have the best success with weight loss program under the guidance of healthy weight and wellness, as they may be able to help identify triggers, discussed specific dietary recommendations and also discuss  possible medications.  Referral  placed.  Essential hypertension - Plan: Comprehensive metabolic panel with GFR  - Stable with current regimen, check labs and adjust plan accordingly.  Erectile dysfunction, unspecified erectile dysfunction type  - Stable with use of Cialis , continue same.  Hyperlipidemia, unspecified hyperlipidemia type - Plan: Hemoglobin A1c, Comprehensive metabolic panel with GFR  -, Diet/exercise approach, check labs and adjust plan accordingly.  Depression, unspecified depression type  - Stable on Wellbutrin , continue same.  No orders of the defined types were placed in this encounter.  Patient Instructions  Thanks for coming in today.  Lab visit next week.  No medication changes at this time.  I will place a referral to healthy weight and wellness as I think they may be the best bet for you for successful weight loss program and to discuss specific meds that may be best for you.  Let them know of typical triggers for dietary changes, and keep up the good work with trying to cut back on alcoholic beverages as those are also likely impacting your ability to lose weight.  If any concerns on labs I will let you know.  Let me know if there are any questions and take care!  Healthy Weight [facility] Wellness Medical Weight Loss Management  [phone]  Preventive Care 49-67 Years Old, Male Preventive care refers to lifestyle choices and visits with your health care provider that can promote health and wellness. Preventive care visits are also  called wellness exams. What can I expect for my preventive care visit? Counseling During your preventive care visit, your health care provider may ask about your: Medical history, including: Past medical problems. Family medical history. Current health, including: Emotional well-being. Home life and relationship well-being. Sexual activity. Lifestyle, including: Alcohol, nicotine or tobacco, and drug use. Access to firearms. Diet, exercise, and sleep habits. Safety issues such as seatbelt and bike helmet use. Sunscreen use. Work and work Astronomer. Physical exam Your health care provider will check your: Height and weight. These may be used to calculate your BMI (body mass index). BMI is a measurement that tells if you are at a healthy weight. Waist circumference. This measures the distance around your waistline. This measurement also tells if you are at a healthy weight and may help predict your risk of certain diseases, such as type 2 diabetes and high blood pressure. Heart rate and blood pressure. Body temperature. Skin for abnormal spots. What immunizations do I need?  Vaccines are usually given at various ages, according to a schedule. Your health care provider will recommend vaccines for you based on your  age, medical history, and lifestyle or other factors, such as travel or where you work. What tests do I need? Screening Your health care provider may recommend screening tests for certain conditions. This may include: Lipid and cholesterol levels. Diabetes screening. This is done by checking your blood sugar (glucose) after you have not eaten for a while (fasting). Hepatitis B test. Hepatitis C test. HIV (human immunodeficiency virus) test. STI (sexually transmitted infection) testing, if you are at risk. Lung cancer screening. Prostate cancer screening. Colorectal cancer screening. Talk with your health care provider about your test results, treatment options, and if  necessary, the need for more tests. Follow these instructions at home: Eating and drinking  Eat a diet that includes fresh fruits and vegetables, whole grains, lean protein, and low-fat dairy products. Take vitamin and mineral supplements as recommended by your health care provider. Do not drink alcohol if your health care provider tells you not to drink. If you drink alcohol: Limit how much you have to 0-2 drinks a day. Know how much alcohol is in your drink. In the [place] one drink equals one 12 oz bottle of beer (355 mL), one 5 oz glass of wine (148 mL), or one 1 oz glass of hard liquor (44 mL). Lifestyle Brush your teeth every morning and night with fluoride toothpaste. Floss one time each day. Exercise for at least 30 minutes 5 or more days each week. Do not use any products that contain nicotine or tobacco. These products include cigarettes, chewing tobacco, and vaping devices, such as e-cigarettes. If you need help quitting, ask your health care provider. Do not use drugs. If you are sexually active, practice safe sex. Use a condom or other form of protection to prevent STIs. Take aspirin only as told by your health care provider. Make sure that you understand how much to take and what form to take. Work with your health care provider to find out whether it is safe and beneficial for you to take aspirin daily. Find healthy ways to manage stress, such as: Meditation, yoga, or listening to music. Journaling. Talking to a trusted person. Spending time with friends and family. Minimize exposure to UV radiation to reduce your risk of skin cancer. Safety Always wear your seat belt while driving or riding in a vehicle. Do not drive: If you have been drinking alcohol. Do not ride with someone who has been drinking. When you are tired or distracted. While texting. If you have been using any mind-altering substances or drugs. Wear a helmet and other protective equipment during sports  activities. If you have firearms in your house, make sure you follow all gun safety procedures. What's next? Go to your health care provider once a year for an annual wellness visit. Ask your health care provider how often you should have your eyes and teeth checked. Stay up to date on all vaccines. This information is not intended to replace advice given to you by your health care provider. Make sure you discuss any questions you have with your health care provider. Document Revised: 05/14/2021 Document Reviewed: 05/14/2021 Elsevier Patient Education  2024 [employer]    Signed,   Reyes Pines, MD [facility], [facility] 07/20/24 3:46 PM

## 2024-07-25 ENCOUNTER — Institutional Professional Consult (permissible substitution): Admitting: Family Medicine

## 2024-07-26 ENCOUNTER — Other Ambulatory Visit (INDEPENDENT_AMBULATORY_CARE_PROVIDER_SITE_OTHER)

## 2024-07-26 DIAGNOSIS — E785 Hyperlipidemia, unspecified: Secondary | ICD-10-CM | POA: Diagnosis not present

## 2024-07-26 DIAGNOSIS — Z6835 Body mass index (BMI) 35.0-35.9, adult: Secondary | ICD-10-CM

## 2024-07-26 DIAGNOSIS — I10 Essential (primary) hypertension: Secondary | ICD-10-CM

## 2024-07-26 DIAGNOSIS — E66812 Obesity, class 2: Secondary | ICD-10-CM

## 2024-07-26 LAB — CBC
HCT: 43.7 % (ref 39.0–52.0)
Hemoglobin: 15 g/dL (ref 13.0–17.0)
MCHC: 34.3 g/dL (ref 30.0–36.0)
MCV: 87.6 fl (ref 78.0–100.0)
Platelets: 308 K/uL (ref 150.0–400.0)
RBC: 4.98 Mil/uL (ref 4.22–5.81)
RDW: 12.7 % (ref 11.5–15.5)
WBC: 4.7 K/uL (ref 4.0–10.5)

## 2024-07-26 LAB — COMPREHENSIVE METABOLIC PANEL WITH GFR
ALT: 18 U/L (ref 0–53)
AST: 23 U/L (ref 0–37)
Albumin: 4.7 g/dL (ref 3.5–5.2)
Alkaline Phosphatase: 64 U/L (ref 39–117)
BUN: 13 mg/dL (ref 6–23)
CO2: 27 meq/L (ref 19–32)
Calcium: 9.6 mg/dL (ref 8.4–10.5)
Chloride: 100 meq/L (ref 96–112)
Creatinine, Ser: 0.96 mg/dL (ref 0.40–1.50)
GFR: 90.39 mL/min (ref >60.00)
Glucose, Bld: 95 mg/dL (ref 70–99)
Potassium: 4 meq/L (ref 3.5–5.1)
Sodium: 138 meq/L (ref 135–145)
Total Bilirubin: 0.8 mg/dL (ref 0.2–1.2)
Total Protein: 7.2 g/dL (ref 6.0–8.3)

## 2024-07-26 LAB — TSH: TSH: 2.74 u[IU]/mL (ref 0.35–5.50)

## 2024-07-26 LAB — LIPID PANEL
Cholesterol: 206 mg/dL — ABNORMAL HIGH (ref 0–200)
HDL: 54.1 mg/dL (ref >39.00)
LDL Cholesterol: 114 mg/dL — ABNORMAL HIGH (ref 0–99)
NonHDL: 151.92
Total CHOL/HDL Ratio: 4
Triglycerides: 190 mg/dL — ABNORMAL HIGH (ref 0.0–149.0)
VLDL: 38 mg/dL (ref 0.0–40.0)

## 2024-07-26 LAB — HEMOGLOBIN A1C: Hgb A1c MFr Bld: 5.2 % (ref 4.6–6.5)

## 2024-07-28 ENCOUNTER — Ambulatory Visit: Payer: Self-pay | Admitting: Family Medicine

## 2024-09-12 ENCOUNTER — Other Ambulatory Visit: Payer: Self-pay | Admitting: Family Medicine

## 2024-09-12 DIAGNOSIS — F32A Depression, unspecified: Secondary | ICD-10-CM

## 2025-01-05 ENCOUNTER — Other Ambulatory Visit: Payer: Self-pay

## 2025-01-05 ENCOUNTER — Emergency Department (HOSPITAL_BASED_OUTPATIENT_CLINIC_OR_DEPARTMENT_OTHER): Admission: EM | Admit: 2025-01-05 | Source: Home / Self Care

## 2025-01-05 ENCOUNTER — Encounter (HOSPITAL_BASED_OUTPATIENT_CLINIC_OR_DEPARTMENT_OTHER): Payer: Self-pay

## 2025-01-05 NOTE — ED Triage Notes (Signed)
 Pt c/o mechanical fall on ice Tuesday, L elbow pain since. States that today, it started swelling & limiting my ROM.  Seen at [facility] for same, said it was a burst bursa sac/ bursa fluid. Has taken one dose of keflex since being seen

## 2025-01-24 ENCOUNTER — Ambulatory Visit: Admitting: Family Medicine
